# Patient Record
Sex: Female | Born: 1945 | Race: White | Hispanic: No | Smoking: Current every day smoker
Health system: Southern US, Community
[De-identification: ages and names within clinical notes are randomized; demographics above are authoritative.]

## PROBLEM LIST (undated history)

## (undated) DIAGNOSIS — R7303 Prediabetes: Secondary | ICD-10-CM

## (undated) DIAGNOSIS — I10 Essential (primary) hypertension: Secondary | ICD-10-CM

## (undated) DIAGNOSIS — N1831 Chronic kidney disease, stage 3a: Secondary | ICD-10-CM

## (undated) DIAGNOSIS — I700 Atherosclerosis of aorta: Secondary | ICD-10-CM

## (undated) DIAGNOSIS — M1990 Unspecified osteoarthritis, unspecified site: Secondary | ICD-10-CM

## (undated) DIAGNOSIS — E039 Hypothyroidism, unspecified: Secondary | ICD-10-CM

## (undated) DIAGNOSIS — F419 Anxiety disorder, unspecified: Secondary | ICD-10-CM

## (undated) DIAGNOSIS — T07XXXA Unspecified multiple injuries, initial encounter: Secondary | ICD-10-CM

## (undated) DIAGNOSIS — Z8719 Personal history of other diseases of the digestive system: Secondary | ICD-10-CM

## (undated) DIAGNOSIS — K219 Gastro-esophageal reflux disease without esophagitis: Secondary | ICD-10-CM

## (undated) DIAGNOSIS — I779 Disorder of arteries and arterioles, unspecified: Secondary | ICD-10-CM

## (undated) DIAGNOSIS — H6090 Unspecified otitis externa, unspecified ear: Secondary | ICD-10-CM

## (undated) DIAGNOSIS — R413 Other amnesia: Secondary | ICD-10-CM

## (undated) DIAGNOSIS — J439 Emphysema, unspecified: Secondary | ICD-10-CM

## (undated) DIAGNOSIS — M810 Age-related osteoporosis without current pathological fracture: Secondary | ICD-10-CM

## (undated) DIAGNOSIS — E040 Nontoxic diffuse goiter: Secondary | ICD-10-CM

## (undated) DIAGNOSIS — E785 Hyperlipidemia, unspecified: Secondary | ICD-10-CM

## (undated) HISTORY — DX: Unspecified osteoarthritis, unspecified site: M19.90

## (undated) HISTORY — DX: Other amnesia: R41.3

## (undated) HISTORY — DX: Hyperlipidemia, unspecified: E78.5

## (undated) HISTORY — DX: Emphysema, unspecified: J43.9

## (undated) HISTORY — DX: Gastro-esophageal reflux disease without esophagitis: K21.9

## (undated) HISTORY — DX: Anxiety disorder, unspecified: F41.9

## (undated) HISTORY — DX: Prediabetes: R73.03

## (undated) HISTORY — DX: Nontoxic diffuse goiter: E04.0

## (undated) HISTORY — DX: Unspecified otitis externa, unspecified ear: H60.90

## (undated) HISTORY — DX: Chronic kidney disease, stage 3a: N18.31

## (undated) HISTORY — DX: Essential (primary) hypertension: I10

## (undated) HISTORY — DX: Personal history of other diseases of the digestive system: Z87.19

## (undated) HISTORY — DX: Unspecified multiple injuries, initial encounter: T07.XXXA

## (undated) HISTORY — DX: Age-related osteoporosis without current pathological fracture: M81.0

## (undated) HISTORY — DX: Disorder of arteries and arterioles, unspecified: I77.9

## (undated) HISTORY — DX: Hypothyroidism, unspecified: E03.9

## (undated) HISTORY — DX: Atherosclerosis of aorta: I70.0

---

## 1997-11-12 ENCOUNTER — Ambulatory Visit (HOSPITAL_COMMUNITY): Admission: RE | Admit: 1997-11-12 | Discharge: 1997-11-12 | Payer: Self-pay | Admitting: *Deleted

## 1998-02-25 ENCOUNTER — Ambulatory Visit (HOSPITAL_COMMUNITY): Admission: RE | Admit: 1998-02-25 | Discharge: 1998-02-25 | Payer: Self-pay | Admitting: *Deleted

## 1999-02-24 ENCOUNTER — Ambulatory Visit (HOSPITAL_COMMUNITY): Admission: RE | Admit: 1999-02-24 | Discharge: 1999-02-24 | Payer: Self-pay | Admitting: *Deleted

## 1999-06-11 ENCOUNTER — Encounter: Payer: Self-pay | Admitting: Internal Medicine

## 1999-06-11 ENCOUNTER — Ambulatory Visit (HOSPITAL_COMMUNITY): Admission: RE | Admit: 1999-06-11 | Discharge: 1999-06-11 | Payer: Self-pay | Admitting: Internal Medicine

## 1999-07-12 ENCOUNTER — Ambulatory Visit (HOSPITAL_COMMUNITY): Admission: RE | Admit: 1999-07-12 | Discharge: 1999-07-12 | Payer: Self-pay | Admitting: Neurology

## 1999-08-22 ENCOUNTER — Ambulatory Visit (HOSPITAL_COMMUNITY): Admission: RE | Admit: 1999-08-22 | Discharge: 1999-08-22 | Payer: Self-pay | Admitting: Neurology

## 1999-08-22 ENCOUNTER — Encounter: Payer: Self-pay | Admitting: Neurology

## 2000-01-09 ENCOUNTER — Other Ambulatory Visit: Admission: RE | Admit: 2000-01-09 | Discharge: 2000-01-09 | Payer: Self-pay | Admitting: *Deleted

## 2000-03-22 ENCOUNTER — Encounter: Payer: Self-pay | Admitting: Internal Medicine

## 2000-03-22 ENCOUNTER — Ambulatory Visit (HOSPITAL_COMMUNITY): Admission: RE | Admit: 2000-03-22 | Discharge: 2000-03-22 | Payer: Self-pay | Admitting: Internal Medicine

## 2000-07-25 ENCOUNTER — Encounter: Payer: Self-pay | Admitting: Internal Medicine

## 2000-07-25 ENCOUNTER — Ambulatory Visit (HOSPITAL_COMMUNITY): Admission: RE | Admit: 2000-07-25 | Discharge: 2000-07-25 | Payer: Self-pay | Admitting: Internal Medicine

## 2001-02-25 ENCOUNTER — Encounter: Payer: Self-pay | Admitting: Neurology

## 2001-02-25 ENCOUNTER — Ambulatory Visit (HOSPITAL_COMMUNITY): Admission: RE | Admit: 2001-02-25 | Discharge: 2001-02-25 | Payer: Self-pay | Admitting: Neurology

## 2001-03-24 ENCOUNTER — Other Ambulatory Visit: Admission: RE | Admit: 2001-03-24 | Discharge: 2001-03-24 | Payer: Self-pay | Admitting: *Deleted

## 2001-05-01 ENCOUNTER — Ambulatory Visit (HOSPITAL_BASED_OUTPATIENT_CLINIC_OR_DEPARTMENT_OTHER): Admission: RE | Admit: 2001-05-01 | Discharge: 2001-05-02 | Payer: Self-pay | Admitting: Orthopedic Surgery

## 2001-05-02 ENCOUNTER — Emergency Department (HOSPITAL_COMMUNITY): Admission: EM | Admit: 2001-05-02 | Discharge: 2001-05-02 | Payer: Self-pay | Admitting: Emergency Medicine

## 2001-05-02 ENCOUNTER — Encounter: Payer: Self-pay | Admitting: Emergency Medicine

## 2001-08-27 ENCOUNTER — Ambulatory Visit (HOSPITAL_BASED_OUTPATIENT_CLINIC_OR_DEPARTMENT_OTHER): Admission: RE | Admit: 2001-08-27 | Discharge: 2001-08-28 | Payer: Self-pay | Admitting: Orthopedic Surgery

## 2001-10-28 ENCOUNTER — Ambulatory Visit (HOSPITAL_COMMUNITY): Admission: RE | Admit: 2001-10-28 | Discharge: 2001-10-28 | Payer: Self-pay | Admitting: Family Medicine

## 2001-10-28 ENCOUNTER — Encounter: Payer: Self-pay | Admitting: Family Medicine

## 2002-04-14 ENCOUNTER — Other Ambulatory Visit: Admission: RE | Admit: 2002-04-14 | Discharge: 2002-04-14 | Payer: Self-pay | Admitting: *Deleted

## 2002-12-25 ENCOUNTER — Encounter: Payer: Self-pay | Admitting: Emergency Medicine

## 2002-12-25 ENCOUNTER — Emergency Department (HOSPITAL_COMMUNITY): Admission: EM | Admit: 2002-12-25 | Discharge: 2002-12-25 | Payer: Self-pay | Admitting: Emergency Medicine

## 2003-04-01 ENCOUNTER — Ambulatory Visit (HOSPITAL_BASED_OUTPATIENT_CLINIC_OR_DEPARTMENT_OTHER): Admission: RE | Admit: 2003-04-01 | Discharge: 2003-04-02 | Payer: Self-pay | Admitting: Orthopedic Surgery

## 2003-04-02 ENCOUNTER — Encounter (INDEPENDENT_AMBULATORY_CARE_PROVIDER_SITE_OTHER): Payer: Self-pay

## 2003-04-09 ENCOUNTER — Encounter: Payer: Self-pay | Admitting: Orthopedic Surgery

## 2003-04-09 ENCOUNTER — Encounter: Admission: RE | Admit: 2003-04-09 | Discharge: 2003-04-09 | Payer: Self-pay | Admitting: Orthopedic Surgery

## 2003-06-09 ENCOUNTER — Other Ambulatory Visit: Admission: RE | Admit: 2003-06-09 | Discharge: 2003-06-09 | Payer: Self-pay | Admitting: *Deleted

## 2004-01-14 ENCOUNTER — Ambulatory Visit (HOSPITAL_COMMUNITY): Admission: RE | Admit: 2004-01-14 | Discharge: 2004-01-14 | Payer: Self-pay | Admitting: *Deleted

## 2004-01-27 ENCOUNTER — Encounter: Admission: RE | Admit: 2004-01-27 | Discharge: 2004-01-27 | Payer: Self-pay | Admitting: Otolaryngology

## 2004-03-24 ENCOUNTER — Encounter: Admission: RE | Admit: 2004-03-24 | Discharge: 2004-03-24 | Payer: Self-pay | Admitting: Otolaryngology

## 2004-06-30 ENCOUNTER — Encounter (INDEPENDENT_AMBULATORY_CARE_PROVIDER_SITE_OTHER): Payer: Self-pay | Admitting: Specialist

## 2004-06-30 ENCOUNTER — Ambulatory Visit (HOSPITAL_COMMUNITY): Admission: RE | Admit: 2004-06-30 | Discharge: 2004-06-30 | Payer: Self-pay | Admitting: *Deleted

## 2005-06-19 ENCOUNTER — Encounter: Admission: RE | Admit: 2005-06-19 | Discharge: 2005-06-19 | Payer: Self-pay | Admitting: Family Medicine

## 2006-01-16 ENCOUNTER — Encounter: Payer: Self-pay | Admitting: Emergency Medicine

## 2006-04-05 ENCOUNTER — Encounter: Admission: RE | Admit: 2006-04-05 | Discharge: 2006-04-05 | Payer: Self-pay | Admitting: Family Medicine

## 2006-04-15 ENCOUNTER — Encounter: Admission: RE | Admit: 2006-04-15 | Discharge: 2006-04-15 | Payer: Self-pay | Admitting: Family Medicine

## 2006-11-12 ENCOUNTER — Other Ambulatory Visit: Admission: RE | Admit: 2006-11-12 | Discharge: 2006-11-12 | Payer: Self-pay | Admitting: *Deleted

## 2007-05-09 ENCOUNTER — Encounter: Admission: RE | Admit: 2007-05-09 | Discharge: 2007-05-09 | Payer: Self-pay | Admitting: Family Medicine

## 2007-05-20 ENCOUNTER — Encounter: Admission: RE | Admit: 2007-05-20 | Discharge: 2007-05-20 | Payer: Self-pay | Admitting: Family Medicine

## 2007-05-20 ENCOUNTER — Other Ambulatory Visit: Admission: RE | Admit: 2007-05-20 | Discharge: 2007-05-20 | Payer: Self-pay | Admitting: Interventional Radiology

## 2007-05-20 ENCOUNTER — Encounter (INDEPENDENT_AMBULATORY_CARE_PROVIDER_SITE_OTHER): Payer: Self-pay | Admitting: Interventional Radiology

## 2007-10-10 ENCOUNTER — Encounter: Admission: RE | Admit: 2007-10-10 | Discharge: 2007-10-10 | Payer: Self-pay | Admitting: Family Medicine

## 2007-11-05 ENCOUNTER — Encounter: Admission: RE | Admit: 2007-11-05 | Discharge: 2007-11-05 | Payer: Self-pay | Admitting: Endocrinology

## 2008-05-03 ENCOUNTER — Encounter: Admission: RE | Admit: 2008-05-03 | Discharge: 2008-05-03 | Payer: Self-pay | Admitting: Endocrinology

## 2008-08-03 ENCOUNTER — Encounter: Admission: RE | Admit: 2008-08-03 | Discharge: 2008-08-03 | Payer: Self-pay | Admitting: Family Medicine

## 2009-09-13 ENCOUNTER — Encounter: Admission: RE | Admit: 2009-09-13 | Discharge: 2009-09-13 | Payer: Self-pay | Admitting: Family Medicine

## 2010-04-26 ENCOUNTER — Encounter: Admission: RE | Admit: 2010-04-26 | Discharge: 2010-04-26 | Payer: Self-pay | Admitting: Endocrinology

## 2010-05-08 ENCOUNTER — Encounter: Admission: RE | Admit: 2010-05-08 | Discharge: 2010-05-08 | Payer: Self-pay | Admitting: Family Medicine

## 2010-10-07 ENCOUNTER — Encounter: Payer: Self-pay | Admitting: Family Medicine

## 2010-10-10 ENCOUNTER — Other Ambulatory Visit: Payer: Self-pay | Admitting: Family Medicine

## 2010-10-10 DIAGNOSIS — Z1239 Encounter for other screening for malignant neoplasm of breast: Secondary | ICD-10-CM

## 2010-10-20 ENCOUNTER — Ambulatory Visit
Admission: RE | Admit: 2010-10-20 | Discharge: 2010-10-20 | Disposition: A | Discharge status: Home / Self Care | Payer: 59 | Source: Ambulatory Visit | Attending: Family Medicine | Admitting: Family Medicine

## 2010-10-20 DIAGNOSIS — Z1239 Encounter for other screening for malignant neoplasm of breast: Secondary | ICD-10-CM

## 2011-02-02 NOTE — Op Note (Signed)
NAME:  Cheryl Burton, Cheryl Burton                          ACCOUNT NO.:  [id number]   MEDICAL RECORD NO.:  [id number]                   PATIENT TYPE:  AMB   LOCATION:  DSC                                  FACILITY:  [facility]   PHYSICIAN:  Loreta Ave, M.D.              DATE OF BIRTH:  1946-06-11   DATE OF PROCEDURE:  04/01/2003  DATE OF DISCHARGE:                                 OPERATIVE REPORT   PREOPERATIVE DIAGNOSIS:  Interosseous symptomatic cyst, distal tibia at  right ankle medial aspect.  Reactive synovitis, chondromalacia of right  ankle.   POSTOPERATIVE DIAGNOSIS:  Interosseous symptomatic cyst, distal tibia at  right ankle medial aspect.  Reactive synovitis, chondromalacia of right  ankle, without extension into the ankle form the cyst.   OPERATION PERFORMED:  Right ankle examination under anesthesia, arthroscopy  with chondroplasty and synovectomy.  Open curettage of tibial cyst and  treatment with allograft __________ AlloMatrix grafting.   SURGEON:  Loreta Ave, M.D.   ASSISTANT:  Arlys John D. Petrarca, P.A.-C.   ANESTHESIA:  General.   ESTIMATED BLOOD LOSS:  Minimal.   TOURNIQUET TIME:  45 minutes.   SPECIMENS:  Cyst lining as well as bony nidus.   CULTURES:  None.   COMPLICATIONS:  None.   DRESSING:  Soft compressive.   DESCRIPTION OF PROCEDURE:  The patient was brought to the operating room and  placed on the operating table in supine position.  After adequate anesthesia  had been obtained, right ankle examined.  Good motion, good stability.  Intraosseous cyst was evident in the tibia just above the ankle and slightly  medial to it at the top of the medial malleolus. Leg holder for ankle  arthroscopy applied after tourniquet.  Prepped and draped in the usual  sterile fashion.  Exsanguinated with elevation and Esmarch.  Tourniquet  inflated to 300 mmHg.  Anteromedial and anterolateral arthroscopy portals  for the ankle.  The ankle entered with blunt obturator  and distended and  inspected!  Anterior synovitis debrided.  Little chondromalacia at the  medial talar dome, focal grade 2, debrided.  No evidence of extension into  the ankle from the cyst itself.  No other abnormalities in the ankle.  The  instruments and fluid removed.  Longitudinal incision over the tibia and  medial aspect.  Skin and subcutaneous tissue divided.  Periosteum elevated.  A cortical window a little bit more than 1 cm square was removed in the  tibia directly over the cyst.  Relatively soft bone there but not a complete  fracture.  The cyst was entered a few millimeters below the cortical margin.  This was an obvious semi solid but also fluid filled cyst with a soft tissue  lining that was completely excised.  At the deep end of the cyst towards the  lateral aspect, there was a red bony lesion that looked like a nidus,  possible  osteoid osteoma.  This was curetted out and sent as a separate  specimen.  The entire cyst was curetted back to healthy bleeding bone with  fluoroscopic confirmation.  It was then packed with __________ AlloMatrix  and the cortical window was placed overtop of this.  Periosteum closed on  top of the Vicryl.  Skin closed with nylon.  Sterile compressive dressing  applied after Marcaine was injected in the incision and ankle and the  portals closed with nylon.  Bulky short leg splint applied.  Tourniquet  deflated and removed.  Anesthesia reversed.  Brought to recovery room.  Tolerated surgery well.  No complications.                                                 Loreta Ave, M.D.    DFM/MEDQ  D:  04/01/2003  T:  04/02/2003  Job:  [id number]

## 2011-02-02 NOTE — Op Note (Signed)
[place]  Patient:    Cheryl Burton, Cheryl Burton Visit Number: [id number] MRN: [id number]          Service Type: DSU Location: [facility] Attending Physician:  Colbert Ewing Dictated by:   Loreta Ave, M.D. Proc. Date: 08/27/01 Admit Date:  08/27/2001                             Operative Report  PREOPERATIVE DIAGNOSIS:  Recurrent rotator cuff tear, right shoulder.  POSTOPERATIVE DIAGNOSIS:  Recurrent rotator cuff tear, right shoulder.  OPERATIVE PROCEDURE:  Repair of recurrent rotator cuff tear, right shoulder including repair of both supraspinatus and infraspinatus tendon avulsion, breaking up subacromial adhesions.  SURGEON:  Loreta Ave, M.D.  ASSISTANT:  Arlys John D. Petrarca, P.A.-C.  ANESTHESIA:  General.  ESTIMATED BLOOD LOSS:  Minimal.  SPECIMENS:  None.  CULTURES:  None.  COMPLICATIONS:  None.  DRESSING:  Self-compressive with sling.  DESCRIPTION OF PROCEDURE:  The patient was brought to the operating room and placed on the operating table in the supine position.  After adequate anesthesia had been obtained, the right shoulder examined.  Good motion and good stability.  Placed in a beach chair position on the shoulder positioner, prepped and draped in the usual sterile fashion.  Anterior and lateral arthroscopic portals utilized.  Arthroscope introduced and the shoulder examined.  Good articular cartilage.  Confirmation of tearing of the supra and infraspinatus tendon with the suture still in place in bone, but the tendon ripped away from the sutures just medial to this.  Not markedly retracted and still mobile.  Biceps tendon and biceps anchor intact.  Subacromially still adequate decompression, but significant adhesions above the cuff, more medial to the humeral head.  With confirmation of pathology, instruments and fluid were removed.  Lateral portal was opened and through a deltoid splitting incision through the  previous incision.  Skin and subcutaneous tissue divided. Subacromial space accessed.  The cuff was assessed and then thoroughly debrided.  The thinned lateral portion of the cuff was excised. Intratendinous ________ extended over 1 cm to 1.5 cm.  This was mobilized and all adhesions above this that were scarring into the under surface of the acromion were completely removed, all the way back to the level of the muscle within the supra and infraspinatus.  I was then able to well-capture the cuff with three #2 fiberwire sutures.  New bony tunnels were created at the humeral attachment of the top of the infraspinatus as well as the entire supraspinatus.  Fiberwire sutures were then weaved through bony tunnels.  Once this was complete, I was able to pull the cuff down into a repair position for a firm watertight closure, and oversewed the sutures over the bony bridges to give a nice firm watertight closure of the cuff.  This yielded a nice firm repair.  Care was taken as the fiberwire weaved well medially into the tendon, so that I would capture as much as the tendon as possible, and I would not have a recurrent tear.  Once this was completely confirmed, the wound was thoroughly irrigated.  The deltoid splitting incision closed with Vicryl.  The skin and subcutaneous tissue closed with Vicryl.  Posterior portal closed with nylon.  Margins of the wound and subacromial space injected with Marcaine.  A sterile compressive dressing and sling applied.  Anesthesia reversed.  Brought to the recovery room.  Tolerated surgery well with  no complications. Dictated by:   Loreta Ave, M.D. Attending Physician:  Colbert Ewing DD:  08/27/01 TD:  08/28/01 Job: [id number]

## 2011-02-02 NOTE — Op Note (Signed)
[place]  Patient:    Cheryl Burton, Cheryl Burton                       MRN: [id number] Proc. Date: 05/01/01 Adm. Date:  [id number] Attending:  Colbert Ewing                           Operative Report  PREOPERATIVE DIAGNOSES: Complete rotator cuff tear right shoulder with chronic impingement and DJD a.c. joint.  POSTOPERATIVE DIAGNOSES:  Complete rotator cuff tear right shoulder with chronic impingement and DJD a.c. joint.  PROCEDURE: Right shoulder examination under anesthesia.  Arthroscopy. Debridement of rotator cuff.  Assessment of tear.  Arthroscopic acromioplasty, bursectomy and CA ligament release.  Excision distal clavicle.  Open repair rotator cuff tear.  Concept repair system.  Sutures tied over bony tunnels.  SURGEON:  Loreta Ave, M.D.  ASSISTANT:  Arlys John D. Petrarca, P.A.-C.  ANESTHESIA:  General.  ESTIMATED BLOOD LOSS:  Minimal.  SPECIMENS:  None.  CULTURES:  None.  COMPLICATIONS:  None.  DRESSINGS: Sterile compressive with shoulder immobilizer.  PROCEDURE:  The patient was brought to the operating room and placed on the operating table in supine position.  After adequate anesthesia had been obtained, right shoulder examined.  Full motion, good stability.  Placed in beach chair position on the shoulder positioner, prepped and draped in the usual sterile fashion.  Three arthroscopic portals anterior, posterior and lateral.  Shoulder entered with a blunt obturator, distended and inspected. Complete avulsion, supraspinatus tendon, with intertendinous tearing. Debrided with a shaver.  Although completely torn off it was mobile and able to be brought over to its attachment side.  Glenohumeral joint otherwise looked good in regards to articular cartilage, labrum, biceps tendon, and capsular ligamentous structures.  Cannula redirected subacromially.  Cuff further assessed.  Type 2 acromion.  Bursa resected.  Acromioplasty to  the top of the acromion with shaver and high speed bur.  CA ligament released. Distal clavicle grade 3 and 4 changes with impingement from distal spurs. Lateral cm sharply dissected.  Adequacy or decompression and clavicle excision confirmed viewing from all portals.  Instruments and fluid removed.  Lateral portal opened into a deltoid splitting incision.  The cuff assessed.  Well captured with 3, #2 fiber wire sutures which were brought out laterally.  A bony trough was made in the humerus adjacent to the tuberosity and prominence of the tuberosity trimmed with a bur.  Concept repair system was then used to create bony tunnels at the attachment site and fiber wire sutures were weaved through this.  They were then firmly tied over bone yielding a nice firm watertight closure of her cuff without undo tension.  Adequate of decompression confirmed digitally at the time of cuff repair.  Able to go through full motion without undue tension on repair.  Wound irrigated. Deltoid closed with Vicryl.  Skin and subcutaneous tissue with Vicryl.  Portals closed with nylon.  Margins of wounds, subacromial space injected with Marcaine.  Sterile compressive dressing and shoulder immobilizer applied.  Anesthesia reversed and brought to recovery room.  Tolerated surgery well.  No complications. DD:  05/01/01 TD:  05/01/01 Job: [id number]

## 2011-02-13 ENCOUNTER — Other Ambulatory Visit: Payer: Self-pay | Admitting: Endocrinology

## 2011-02-13 DIAGNOSIS — E049 Nontoxic goiter, unspecified: Secondary | ICD-10-CM

## 2011-03-02 ENCOUNTER — Ambulatory Visit
Admission: RE | Admit: 2011-03-02 | Discharge: 2011-03-02 | Disposition: A | Discharge status: Home / Self Care | Payer: 59 | Source: Ambulatory Visit | Attending: Endocrinology | Admitting: Endocrinology

## 2011-03-02 DIAGNOSIS — E049 Nontoxic goiter, unspecified: Secondary | ICD-10-CM

## 2011-04-30 ENCOUNTER — Other Ambulatory Visit: Payer: 59

## 2011-10-23 ENCOUNTER — Ambulatory Visit
Admission: RE | Admit: 2011-10-23 | Discharge: 2011-10-23 | Disposition: A | Discharge status: Home / Self Care | Payer: 59 | Source: Ambulatory Visit | Attending: Family Medicine | Admitting: Family Medicine

## 2011-10-23 ENCOUNTER — Other Ambulatory Visit: Payer: Self-pay | Admitting: Family Medicine

## 2011-10-23 DIAGNOSIS — F17200 Nicotine dependence, unspecified, uncomplicated: Secondary | ICD-10-CM

## 2011-10-23 DIAGNOSIS — R0781 Pleurodynia: Secondary | ICD-10-CM

## 2011-10-23 MED ORDER — IOHEXOL 300 MG/ML  SOLN
75.0000 mL | Freq: Once | INTRAMUSCULAR | Status: AC | PRN
  Administered 2011-10-23: 75 mL via INTRAVENOUS

## 2011-11-02 ENCOUNTER — Other Ambulatory Visit: Payer: Self-pay | Admitting: Family Medicine

## 2011-11-02 DIAGNOSIS — Z1231 Encounter for screening mammogram for malignant neoplasm of breast: Secondary | ICD-10-CM

## 2011-11-15 ENCOUNTER — Ambulatory Visit
Admission: RE | Admit: 2011-11-15 | Discharge: 2011-11-15 | Disposition: A | Discharge status: Home / Self Care | Payer: 59 | Source: Ambulatory Visit | Attending: Family Medicine | Admitting: Family Medicine

## 2011-11-15 DIAGNOSIS — Z1231 Encounter for screening mammogram for malignant neoplasm of breast: Secondary | ICD-10-CM

## 2012-09-01 ENCOUNTER — Ambulatory Visit (INDEPENDENT_AMBULATORY_CARE_PROVIDER_SITE_OTHER): Payer: Medicare Other | Admitting: *Deleted

## 2012-09-01 DIAGNOSIS — I6529 Occlusion and stenosis of unspecified carotid artery: Secondary | ICD-10-CM

## 2012-09-26 ENCOUNTER — Other Ambulatory Visit: Payer: Self-pay | Admitting: Endocrinology

## 2012-09-26 DIAGNOSIS — E049 Nontoxic goiter, unspecified: Secondary | ICD-10-CM

## 2012-09-29 ENCOUNTER — Ambulatory Visit
Admission: RE | Admit: 2012-09-29 | Discharge: 2012-09-29 | Disposition: A | Discharge status: Home / Self Care | Payer: Medicare Other | Source: Ambulatory Visit | Attending: Endocrinology | Admitting: Endocrinology

## 2012-09-29 DIAGNOSIS — E049 Nontoxic goiter, unspecified: Secondary | ICD-10-CM

## 2012-10-02 ENCOUNTER — Other Ambulatory Visit: Payer: Self-pay | Admitting: Endocrinology

## 2012-10-02 DIAGNOSIS — E049 Nontoxic goiter, unspecified: Secondary | ICD-10-CM

## 2013-02-12 ENCOUNTER — Other Ambulatory Visit: Payer: Self-pay

## 2013-02-12 DIAGNOSIS — Z1231 Encounter for screening mammogram for malignant neoplasm of breast: Secondary | ICD-10-CM

## 2013-03-13 ENCOUNTER — Ambulatory Visit: Payer: Medicare Other

## 2013-03-13 ENCOUNTER — Ambulatory Visit
Admission: RE | Admit: 2013-03-13 | Discharge: 2013-03-13 | Disposition: A | Discharge status: Home / Self Care | Payer: Medicare Other | Source: Ambulatory Visit

## 2013-03-13 DIAGNOSIS — Z1231 Encounter for screening mammogram for malignant neoplasm of breast: Secondary | ICD-10-CM

## 2013-04-06 ENCOUNTER — Other Ambulatory Visit: Payer: Medicare Other

## 2013-04-08 ENCOUNTER — Ambulatory Visit
Admission: RE | Admit: 2013-04-08 | Discharge: 2013-04-08 | Disposition: A | Discharge status: Home / Self Care | Payer: Medicare Other | Source: Ambulatory Visit | Attending: Endocrinology | Admitting: Endocrinology

## 2013-04-08 DIAGNOSIS — E049 Nontoxic goiter, unspecified: Secondary | ICD-10-CM

## 2013-09-30 ENCOUNTER — Other Ambulatory Visit: Payer: Self-pay | Admitting: Endocrinology

## 2013-09-30 DIAGNOSIS — E049 Nontoxic goiter, unspecified: Secondary | ICD-10-CM

## 2014-02-19 ENCOUNTER — Other Ambulatory Visit: Payer: Self-pay

## 2014-02-19 DIAGNOSIS — Z1231 Encounter for screening mammogram for malignant neoplasm of breast: Secondary | ICD-10-CM

## 2014-03-15 ENCOUNTER — Ambulatory Visit
Admission: RE | Admit: 2014-03-15 | Discharge: 2014-03-15 | Disposition: A | Discharge status: Home / Self Care | Payer: Medicare Other | Source: Ambulatory Visit

## 2014-03-15 ENCOUNTER — Encounter (INDEPENDENT_AMBULATORY_CARE_PROVIDER_SITE_OTHER): Payer: Self-pay

## 2014-03-15 DIAGNOSIS — Z1231 Encounter for screening mammogram for malignant neoplasm of breast: Secondary | ICD-10-CM

## 2014-09-22 ENCOUNTER — Other Ambulatory Visit: Payer: Medicare Other

## 2014-09-23 ENCOUNTER — Ambulatory Visit
Admission: RE | Admit: 2014-09-23 | Discharge: 2014-09-23 | Disposition: A | Discharge status: Home / Self Care | Payer: Medicare Other | Source: Ambulatory Visit | Attending: Endocrinology | Admitting: Endocrinology

## 2014-09-23 DIAGNOSIS — E049 Nontoxic goiter, unspecified: Secondary | ICD-10-CM

## 2015-04-05 ENCOUNTER — Other Ambulatory Visit: Payer: Self-pay | Admitting: Family Medicine

## 2015-04-05 DIAGNOSIS — Z1231 Encounter for screening mammogram for malignant neoplasm of breast: Secondary | ICD-10-CM

## 2015-04-11 ENCOUNTER — Ambulatory Visit
Admission: RE | Admit: 2015-04-11 | Discharge: 2015-04-11 | Disposition: A | Discharge status: Home / Self Care | Payer: Medicare Other | Source: Ambulatory Visit | Attending: Family Medicine | Admitting: Family Medicine

## 2015-04-11 DIAGNOSIS — Z1231 Encounter for screening mammogram for malignant neoplasm of breast: Secondary | ICD-10-CM

## 2015-10-06 ENCOUNTER — Other Ambulatory Visit: Payer: Self-pay | Admitting: Endocrinology

## 2015-10-07 ENCOUNTER — Other Ambulatory Visit: Payer: Self-pay | Admitting: Endocrinology

## 2015-10-07 DIAGNOSIS — E049 Nontoxic goiter, unspecified: Secondary | ICD-10-CM

## 2016-03-21 ENCOUNTER — Other Ambulatory Visit: Payer: Self-pay | Admitting: Family Medicine

## 2016-03-21 DIAGNOSIS — Z1231 Encounter for screening mammogram for malignant neoplasm of breast: Secondary | ICD-10-CM

## 2016-04-11 ENCOUNTER — Ambulatory Visit: Payer: Medicare Other

## 2016-04-19 ENCOUNTER — Ambulatory Visit: Payer: Medicare Other

## 2016-06-05 ENCOUNTER — Ambulatory Visit: Payer: Medicare Other

## 2016-06-08 ENCOUNTER — Ambulatory Visit
Admission: RE | Admit: 2016-06-08 | Discharge: 2016-06-08 | Disposition: A | Discharge status: Home / Self Care | Payer: Medicare Other | Source: Ambulatory Visit | Attending: Family Medicine | Admitting: Family Medicine

## 2016-06-08 DIAGNOSIS — Z1231 Encounter for screening mammogram for malignant neoplasm of breast: Secondary | ICD-10-CM

## 2016-10-04 ENCOUNTER — Other Ambulatory Visit: Payer: Medicare Other

## 2016-10-09 ENCOUNTER — Ambulatory Visit
Admission: RE | Admit: 2016-10-09 | Discharge: 2016-10-09 | Disposition: A | Discharge status: Home / Self Care | Payer: Medicare Other | Source: Ambulatory Visit | Attending: Endocrinology | Admitting: Endocrinology

## 2016-10-09 DIAGNOSIS — E049 Nontoxic goiter, unspecified: Secondary | ICD-10-CM

## 2017-01-09 ENCOUNTER — Ambulatory Visit (INDEPENDENT_AMBULATORY_CARE_PROVIDER_SITE_OTHER): Payer: Medicare Other

## 2017-01-09 ENCOUNTER — Ambulatory Visit (INDEPENDENT_AMBULATORY_CARE_PROVIDER_SITE_OTHER): Payer: Medicare Other | Admitting: Orthopedic Surgery

## 2017-01-09 ENCOUNTER — Encounter (INDEPENDENT_AMBULATORY_CARE_PROVIDER_SITE_OTHER): Payer: Self-pay | Admitting: Orthopedic Surgery

## 2017-01-09 VITALS — BP 123/77 | HR 75 | Ht 60.0 in | Wt 137.0 lb

## 2017-01-09 DIAGNOSIS — G8929 Other chronic pain: Secondary | ICD-10-CM | POA: Diagnosis not present

## 2017-01-09 DIAGNOSIS — M25552 Pain in left hip: Secondary | ICD-10-CM

## 2017-01-09 DIAGNOSIS — M25551 Pain in right hip: Secondary | ICD-10-CM | POA: Diagnosis not present

## 2017-01-09 DIAGNOSIS — M5442 Lumbago with sciatica, left side: Secondary | ICD-10-CM | POA: Diagnosis not present

## 2017-01-09 DIAGNOSIS — M7062 Trochanteric bursitis, left hip: Secondary | ICD-10-CM | POA: Diagnosis not present

## 2017-01-09 DIAGNOSIS — M7061 Trochanteric bursitis, right hip: Secondary | ICD-10-CM | POA: Insufficient documentation

## 2017-01-09 NOTE — Progress Notes (Signed)
Office Visit Note   Patient: Cheryl Burton           Date of Birth: [birth year]           MRN: [id number] Visit Date: 01/09/2017              Requested by: No referring provider defined for this encounter. PCP: Duane Lope, MD   Assessment & Plan: Visit Diagnoses:  1. Chronic midline low back pain with left-sided sciatica   2. Pain in left hip   3. Pain in right hip   4. Trochanteric bursitis of both hips     Plan:  #1: PT evaluation for stretches and Ionto/phonophoresis to both trochanteric regions. #2: Samples of Pennsaid that she continues on the trochanteric regions #3: If her symptoms are not improved then we will consider an MRI scan of the pelvis  Follow-Up Instructions: Return if symptoms worsen or fail to improve.   Orders:  Orders Placed This Encounter  Procedures  . XR Lumbar Spine 2-3 Views  . XR HIP UNILAT W OR W/O PELVIS 2-3 VIEWS LEFT  . Ambulatory referral to Physical Therapy   No orders of the defined types were placed in this encounter.     Procedures: No procedures performed   Clinical Data: No additional findings.   Subjective: Chief Complaint  Patient presents with  . Left Hip - Pain  . Right Hip - Pain  . New Evaluation    Bilateral lateral hip pain and low back pain ongoing x 6 months. PCP recommended she discontinue lipitor this has helped some (off for 6wks now).   . New Evaluation    Groin pain on occasion. Denies radicular pain down the legs. Denies numbness or tingling.  . New Evaluation    Taking tyenol prn    Cheryl Burton is a very pleasant 71 year old white female with care for in the past. She states that she has ongoing low back pain which appears unchanged. Though she states that she has had pain in both lower extremities as well as in the lateral aspects of her hip. She brought that up to her medical doctor who chose to stop her Lipitor which she states did have benefit especially in the areas below her knees. However she continues  to have pain at the trochanteric region of both hips. She denies groin pain. Denies any recent history of injury or trauma. Seen today for evaluation.    Review of Systems  Psychiatric/Behavioral:       History of depression and nervous tension  All other systems reviewed and are negative.    Objective: Vital Signs: BP 123/77 (BP Location: Right Arm, Patient Position: Sitting)   Pulse 75   Ht 5' (1.524 m)   Wt 137 lb (62.1 kg)   BMI 26.76 kg/m   Physical Exam  Constitutional: She is oriented to person, place, and time. She appears well-developed and well-nourished.  HENT:  Head: Normocephalic and atraumatic.  Eyes: EOM are normal. Pupils are equal, round, and reactive to light.  Pulmonary/Chest: Effort normal.  Neurological: She is alert and oriented to person, place, and time.  Skin: Skin is warm and dry.  Psychiatric: She has a normal mood and affect. Her behavior is normal. Judgment and thought content normal.    Ortho Exam  Today she has tenderness over the trochanteric region of both hips. She's got good motion of her hips. She is neurovascular intact distally. She also has some tenderness to palpation over the  lower lumbar spine. Negative straight leg raising.  Specialty Comments:  No specialty comments available.  Imaging: Xr Hip Unilat W Or W/o Pelvis 2-3 Views Left  Result Date: 01/09/2017 Three-view x-rays of the pelvis and hips reveals the greater trochanteric area has spurring noted bilaterally. 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 Joint spaces are decreased but the wound in both hips. Seemed to have osteopenia radiographically  Xr Lumbar Spine 2-3 Views  Result Date: 01/09/2017 Two-view lumbar spine reveals significant degenerative disc disease and narrowing at L5-S1. Foraminal narrowing is also noted at that area. There appears to be and grade 1 spondylolisthesis of  L4 on 5. She also has some sclerosing superior L2 and L3. She does have a mild scoliosis noted to the lumbar spine.    PMFS History: Patient Active Problem List   Diagnosis Date Noted  . Chronic midline low back pain with left-sided sciatica 01/09/2017  . Pain in right hip 01/09/2017  . Trochanteric bursitis of both hips 01/09/2017   History reviewed. No pertinent past medical history.  History reviewed. No pertinent family history.  History reviewed. No pertinent surgical history. Social History   Occupational History  . Not on file.   Social History Main Topics  . Smoking status: Never Smoker  . Smokeless tobacco: Never Used  . Alcohol use Not on file  . Drug use: Unknown  . Sexual activity: Not on file

## 2017-03-07 ENCOUNTER — Other Ambulatory Visit: Payer: Self-pay | Admitting: Family Medicine

## 2017-03-07 DIAGNOSIS — R1011 Right upper quadrant pain: Secondary | ICD-10-CM

## 2017-03-15 ENCOUNTER — Ambulatory Visit
Admission: RE | Admit: 2017-03-15 | Discharge: 2017-03-15 | Disposition: A | Discharge status: Home / Self Care | Payer: Medicare Other | Source: Ambulatory Visit | Attending: Family Medicine | Admitting: Family Medicine

## 2017-03-15 DIAGNOSIS — R1011 Right upper quadrant pain: Secondary | ICD-10-CM

## 2017-06-20 ENCOUNTER — Other Ambulatory Visit: Payer: Self-pay | Admitting: Family Medicine

## 2017-06-20 DIAGNOSIS — Z1231 Encounter for screening mammogram for malignant neoplasm of breast: Secondary | ICD-10-CM

## 2017-07-03 ENCOUNTER — Ambulatory Visit: Payer: Medicare Other

## 2017-07-04 ENCOUNTER — Ambulatory Visit: Payer: Medicare Other

## 2017-07-23 ENCOUNTER — Ambulatory Visit
Admission: RE | Admit: 2017-07-23 | Discharge: 2017-07-23 | Disposition: A | Discharge status: Home / Self Care | Payer: Medicare Other | Source: Ambulatory Visit | Attending: Family Medicine | Admitting: Family Medicine

## 2017-07-23 DIAGNOSIS — Z1231 Encounter for screening mammogram for malignant neoplasm of breast: Secondary | ICD-10-CM

## 2017-10-14 ENCOUNTER — Other Ambulatory Visit: Payer: Self-pay | Admitting: Acute Care

## 2017-10-14 DIAGNOSIS — Z122 Encounter for screening for malignant neoplasm of respiratory organs: Secondary | ICD-10-CM

## 2017-10-14 DIAGNOSIS — F17210 Nicotine dependence, cigarettes, uncomplicated: Secondary | ICD-10-CM

## 2017-10-21 ENCOUNTER — Ambulatory Visit (INDEPENDENT_AMBULATORY_CARE_PROVIDER_SITE_OTHER): Payer: Medicare Other | Admitting: Acute Care

## 2017-10-21 ENCOUNTER — Ambulatory Visit (INDEPENDENT_AMBULATORY_CARE_PROVIDER_SITE_OTHER)
Admission: RE | Admit: 2017-10-21 | Discharge: 2017-10-21 | Disposition: A | Discharge status: Home / Self Care | Payer: Medicare Other | Source: Ambulatory Visit | Attending: Acute Care | Admitting: Acute Care

## 2017-10-21 ENCOUNTER — Encounter: Payer: Self-pay | Admitting: Acute Care

## 2017-10-21 DIAGNOSIS — Z122 Encounter for screening for malignant neoplasm of respiratory organs: Secondary | ICD-10-CM

## 2017-10-21 DIAGNOSIS — F17210 Nicotine dependence, cigarettes, uncomplicated: Secondary | ICD-10-CM

## 2017-10-21 NOTE — Progress Notes (Signed)
Shared Decision Making Visit Lung Cancer Screening Program [phone]([id number])   Eligibility:  Age 72 y.o.  Pack Years Smoking History Calculation 30 pack year smoking history (# packs/per year x # years smoked)  Recent History of coughing up blood  no  Unexplained weight loss? no ( >Than 15 pounds within the last 6 months )  Prior History Lung / other cancer no (Diagnosis within the last 5 years already requiring surveillance chest CT Scans).  Smoking Status Current Smoker  Former Smokers: Years since quit: NA  Quit Date: NA  Visit Components:  Discussion included one or more decision making aids. yes  Discussion included risk/benefits of screening. yes  Discussion included potential follow up diagnostic testing for abnormal scans. yes  Discussion included meaning and risk of over diagnosis. yes  Discussion included meaning and risk of False Positives. yes  Discussion included meaning of total radiation exposure. yes  Counseling Included:  Importance of adherence to annual lung cancer LDCT screening. yes  Impact of comorbidities on ability to participate in the program. yes  Ability and willingness to under diagnostic treatment. yes  Smoking Cessation Counseling:  Current Smokers:   Discussed importance of smoking cessation. yes  Information about tobacco cessation classes and interventions provided to patient. yes  Patient provided with "ticket" for LDCT Scan. yes  Symptomatic Patient. no  Counseling  Diagnosis Code: Tobacco Use Z72.0  Asymptomatic Patient yes  Counseling (Intermediate counseling: > three minutes counseling) [id number]  Former Smokers:   Discussed the importance of maintaining cigarette abstinence. yes  Diagnosis Code: Personal History of Nicotine Dependence. Y86.578Z87.891  Information about tobacco cessation classes and interventions provided to patient. Yes  Patient provided with "ticket" for LDCT Scan. yes  Written Order for Lung Cancer  Screening with LDCT placed in Epic. Yes (CT Chest Lung Cancer Screening Low Dose W/O CM) [id number] Z12.2-Screening of respiratory organs Z87.891-Personal history of nicotine dependence   I have spent 25 minutes of face to face time with Ms. Cheryl Burton discussing the risks and benefits of lung cancer screening. We viewed a power point together that explained in detail the above noted topics. We paused at intervals to allow for questions to be asked and answered to ensure understanding.We discussed that the single most powerful action that she can take to decrease her risk of developing lung cancer is to quit smoking. We discussed whether or not she  is ready to commit to setting a quit date. We discussed options for tools to aid in quitting smoking including nicotine replacement therapy, non-nicotine medications, support groups, Quit Smart classes, and behavior modification. We discussed that often times setting smaller, more achievable goals, such as eliminating 1 cigarette a day for a week and then 2 cigarettes a day for a week can be helpful in slowly decreasing the number of cigarettes smoked. This allows for a sense of accomplishment as well as providing a clinical benefit. I gave her the " Be Stronger Than Your Excuses" card with contact information for community resources, classes, free nicotine replacement therapy, and access to mobile apps, text messaging, and on-line smoking cessation help. I have also given her my card and contact information in the event she needs to contact me. We discussed the time and location of the scan, and that either Abigail Miyamotoenise Phelps RN or I will call with the results within 24-48 hours of receiving them. I have offered her  a copy of the power point we viewed  as a resource in the event they need  reinforcement of the concepts we discussed today in the office. The patient verbalized understanding of all of  the above and had no further questions upon leaving the office. They have  my contact information in the event they have any further questions.  I spent 4 minutes counseling on smoking cessation and the health risks of continued tobacco abuse.  I explained to the patient that there has been a high incidence of coronary artery disease noted on these exams. I explained that this is a non-gated exam therefore degree or severity cannot be determined. This patient is currently on statin therapy. I have asked the patient to follow-up with their PCP regarding any incidental finding of coronary artery disease and management with diet or medication as their PCP  feels is clinically indicated. The patient verbalized understanding of the above and had no further questions upon completion of the visit.     Bevelyn Ngo, NP 10/21/2017

## 2017-10-25 ENCOUNTER — Telehealth: Payer: Self-pay | Admitting: Acute Care

## 2017-10-25 DIAGNOSIS — F17210 Nicotine dependence, cigarettes, uncomplicated: Secondary | ICD-10-CM

## 2017-10-25 DIAGNOSIS — Z122 Encounter for screening for malignant neoplasm of respiratory organs: Secondary | ICD-10-CM

## 2017-10-25 NOTE — Telephone Encounter (Signed)
Pt informed of CT results per Sarah Groce, NP.  PT verbalized understanding.  Copy sent to PCP.  Order placed for 1 yr f/u CT.  

## 2017-11-07 ENCOUNTER — Other Ambulatory Visit: Payer: Self-pay | Admitting: Family Medicine

## 2017-11-07 DIAGNOSIS — R918 Other nonspecific abnormal finding of lung field: Secondary | ICD-10-CM

## 2017-11-19 ENCOUNTER — Ambulatory Visit
Admission: RE | Admit: 2017-11-19 | Discharge: 2017-11-19 | Disposition: A | Discharge status: Home / Self Care | Payer: Medicare Other | Source: Ambulatory Visit | Attending: Family Medicine | Admitting: Family Medicine

## 2017-11-19 DIAGNOSIS — R918 Other nonspecific abnormal finding of lung field: Secondary | ICD-10-CM

## 2018-06-25 ENCOUNTER — Other Ambulatory Visit: Payer: Self-pay | Admitting: Endocrinology

## 2018-06-25 DIAGNOSIS — E049 Nontoxic goiter, unspecified: Secondary | ICD-10-CM

## 2018-06-30 ENCOUNTER — Ambulatory Visit
Admission: RE | Admit: 2018-06-30 | Discharge: 2018-06-30 | Disposition: A | Discharge status: Home / Self Care | Payer: Medicare Other | Source: Ambulatory Visit | Attending: Endocrinology | Admitting: Endocrinology

## 2018-06-30 DIAGNOSIS — E049 Nontoxic goiter, unspecified: Secondary | ICD-10-CM

## 2018-10-20 ENCOUNTER — Ambulatory Visit (INDEPENDENT_AMBULATORY_CARE_PROVIDER_SITE_OTHER): Payer: Medicare Other | Admitting: Family Medicine

## 2018-10-20 ENCOUNTER — Encounter (INDEPENDENT_AMBULATORY_CARE_PROVIDER_SITE_OTHER): Payer: Self-pay | Admitting: Family Medicine

## 2018-10-20 DIAGNOSIS — M7061 Trochanteric bursitis, right hip: Secondary | ICD-10-CM | POA: Diagnosis not present

## 2018-10-20 DIAGNOSIS — M79671 Pain in right foot: Secondary | ICD-10-CM | POA: Diagnosis not present

## 2018-10-20 DIAGNOSIS — M25551 Pain in right hip: Secondary | ICD-10-CM

## 2018-10-20 DIAGNOSIS — M79672 Pain in left foot: Secondary | ICD-10-CM | POA: Diagnosis not present

## 2018-10-20 MED ORDER — NITROGLYCERIN 0.1 MG/HR TD PT24: MEDICATED_PATCH | TRANSDERMAL | 3 refills | Status: DC

## 2018-10-20 NOTE — Addendum Note (Signed)
Addended by: Lillia Carmel on: 10/20/2018 01:14 PM   Modules accepted: Orders

## 2018-10-20 NOTE — Progress Notes (Addendum)
Office Visit Note   Patient: Cheryl Burton           Date of Birth: [birth year]           MRN: [id number] Visit Date: 10/20/2018 Requested by: Daisy Florooss, Charles Alan, MD [address] [place], [place] [address] PCP: Daisy Florooss, Charles Alan, MD  Subjective: Chief Complaint  Patient presents with  . Right Foot - Pain    Pain and discoloration 2nd toe since Summer 2019  . Left Foot - Pain    Pain and discoloration in 3rd toe since Summer 2019. Lump on bottom of foot.  . Right Hip - Pain    Pain lateral and posterior hip (has seen Jacqualine CodeBrian Petrarca, PA in the past for this).    HPI: She is a 73 year old with right hip and bilateral foot pain.  Hip started bothering her in the past month or 2.  History of trochanteric syndrome in the past.  Pain on the posterior lateral aspect.  For the past 6 months she has had intermittent pain and discoloration in her left third and right second toe.  There is also a sore on the bottom of her left third MTP joint that has been very slow to heal.  She is a long-term smoker, approximately 1/2 pack daily.                ROS: She has a history of osteoporosis.  Thyroid has been well controlled.  She has prediabetes.  No history of cardiac disease or arrhythmias.Other systems were reviewed and are negative.    Objective: Vital Signs: There were no vitals taken for this visit.  Physical Exam:  Right hip: No pain with internal rotation and she has good range of motion.  She is tender on the posterior aspect of the greater trochanter. Feet: 2+ dorsalis pedis pulse in both feet.  She has erythema with no warmth in the right foot second toe and left third toe.  Sluggish capillary refill.  On the plantar aspect of her left foot third MTP joint there is an area of erythema with no opening, slightly tender to palpation.  No warmth.  Imaging: None today.  Previous hip x-rays showed spurring at the greater trochanter with well-preserved joint space.  Assessment &  Plan: 1.  Right hip greater trochanter syndrome -Pen said, lateral leg raises.  Physical therapy or injection if symptoms persist.  2.  Right second and left third toe erythema and pain, possibly abnormality of microcirculation which could be related to smoking.  Buerger's disease is a consideration. -Strongly encouraged her to quit smoking. -Trial of nitroglycerin patch therapy just proximal to the affected toes. -Vascular studies will be ordered ([facility]).   Follow-Up Instructions: No follow-ups on file.      Procedures: No procedures performed  No notes on file    PMFS History: Patient Active Problem List   Diagnosis Date Noted  . Chronic midline low back pain with left-sided sciatica 01/09/2017  . Pain in right hip 01/09/2017  . Trochanteric bursitis of both hips 01/09/2017   History reviewed. No pertinent past medical history.  Family History  Problem Relation Age of Onset  . Breast cancer Neg Hx     History reviewed. No pertinent surgical history. Social History   Occupational History  . Not on file  Tobacco Use  . Smoking status: Current Every Day Smoker    Packs/day: 0.90    Years: 34.00    Pack years: 30.60  Types: Cigarettes  . Smokeless tobacco: Never Used  . Tobacco comment: Smokes afew cigarettes a day based on stress  Substance and Sexual Activity  . Alcohol use: Not on file  . Drug use: Not on file  . Sexual activity: Not on file

## 2018-10-23 ENCOUNTER — Inpatient Hospital Stay: Admission: RE | Admit: 2018-10-23 | Payer: Medicare Other | Source: Ambulatory Visit

## 2018-10-28 ENCOUNTER — Telehealth (INDEPENDENT_AMBULATORY_CARE_PROVIDER_SITE_OTHER): Payer: Self-pay | Admitting: Family Medicine

## 2018-10-28 ENCOUNTER — Ambulatory Visit (HOSPITAL_COMMUNITY)
Admission: RE | Admit: 2018-10-28 | Discharge: 2018-10-28 | Disposition: A | Discharge status: Home / Self Care | Payer: Medicare Other | Source: Ambulatory Visit | Attending: Family | Admitting: Family

## 2018-10-28 DIAGNOSIS — M79672 Pain in left foot: Secondary | ICD-10-CM | POA: Diagnosis present

## 2018-10-28 DIAGNOSIS — M79671 Pain in right foot: Secondary | ICD-10-CM | POA: Insufficient documentation

## 2018-10-28 NOTE — Telephone Encounter (Signed)
Dopplers look normal.

## 2018-10-29 NOTE — Telephone Encounter (Signed)
I called and advised the patient. She reported the NitroDur patches are helping. Dr. Prince Rome advised.

## 2018-11-04 ENCOUNTER — Ambulatory Visit (INDEPENDENT_AMBULATORY_CARE_PROVIDER_SITE_OTHER): Payer: Medicare Other | Admitting: Orthopedic Surgery

## 2018-11-11 ENCOUNTER — Ambulatory Visit (INDEPENDENT_AMBULATORY_CARE_PROVIDER_SITE_OTHER)
Admission: RE | Admit: 2018-11-11 | Discharge: 2018-11-11 | Disposition: A | Discharge status: Home / Self Care | Payer: Medicare Other | Source: Ambulatory Visit | Attending: Acute Care | Admitting: Acute Care

## 2018-11-11 DIAGNOSIS — F17210 Nicotine dependence, cigarettes, uncomplicated: Secondary | ICD-10-CM | POA: Diagnosis not present

## 2018-11-11 DIAGNOSIS — Z122 Encounter for screening for malignant neoplasm of respiratory organs: Secondary | ICD-10-CM

## 2018-11-12 ENCOUNTER — Telehealth: Payer: Self-pay | Admitting: Acute Care

## 2018-11-12 DIAGNOSIS — Z122 Encounter for screening for malignant neoplasm of respiratory organs: Secondary | ICD-10-CM

## 2018-11-12 DIAGNOSIS — F17210 Nicotine dependence, cigarettes, uncomplicated: Secondary | ICD-10-CM

## 2018-11-12 DIAGNOSIS — Z87891 Personal history of nicotine dependence: Secondary | ICD-10-CM

## 2018-11-12 NOTE — Telephone Encounter (Signed)
Pt informed of CT results per Sarah Groce, NP.  PT verbalized understanding.  Copy sent to PCP.  Order placed for 1 yr f/u CT.  

## 2018-11-26 ENCOUNTER — Other Ambulatory Visit: Payer: Self-pay | Admitting: Family Medicine

## 2018-11-26 DIAGNOSIS — Z1231 Encounter for screening mammogram for malignant neoplasm of breast: Secondary | ICD-10-CM

## 2018-12-30 ENCOUNTER — Ambulatory Visit: Payer: Medicare Other

## 2019-01-28 ENCOUNTER — Other Ambulatory Visit: Payer: Self-pay | Admitting: Family Medicine

## 2019-01-28 MED ORDER — NITROGLYCERIN 0.1 MG/HR TD PT24: MEDICATED_PATCH | TRANSDERMAL | 3 refills | Status: DC

## 2019-02-10 IMAGING — CT CT CHEST LUNG CANCER SCREENING LOW DOSE W/O CM
2 of 4 series · 15 of 40 positions shown, 18 images · non-contrast
Comparison: No prior screening exam. Diagnostic CT 10/23/2011
reviewed.

CLINICAL DATA: Asymptomatic current smoker with 30 pack-year
history.

EXAM:
CT CHEST WITHOUT CONTRAST LOW-DOSE FOR LUNG CANCER SCREENING
TECHNIQUE: Multidetector CT imaging of the chest was performed following the
standard protocol without IV contrast.

[Series 2: thorax 5.0 i31f 3 · axial · 0.71mm/px · z∈[-119,+146]mm · 12 of 61 slices shown, 15 images]
[im 5/61  mediastinal]
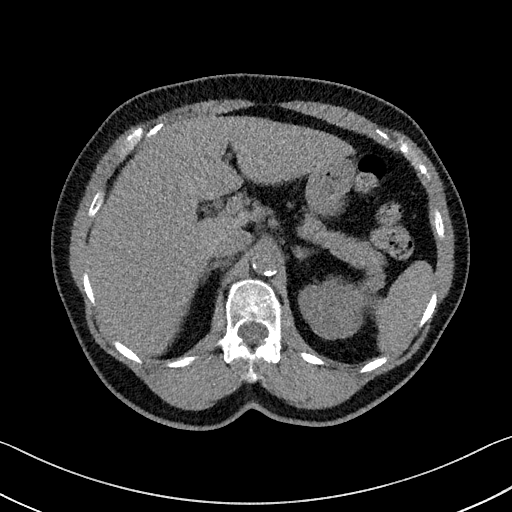
[im 5/61  lung]
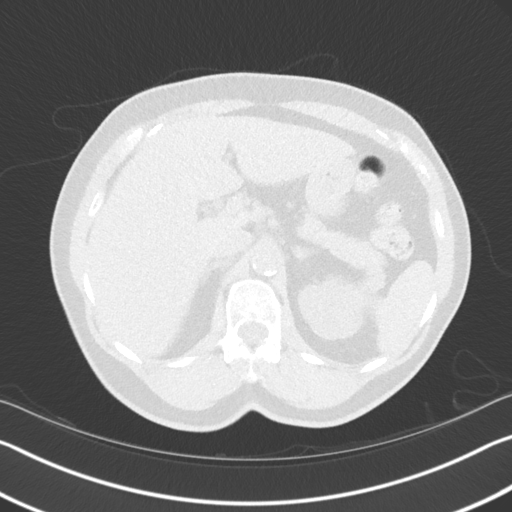
[im 10/61  lung]
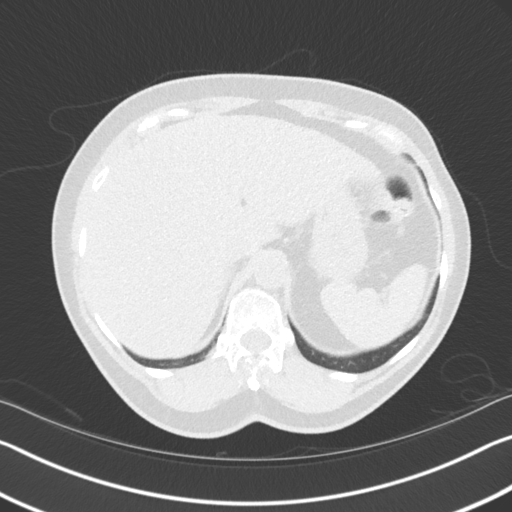
[im 15/61  lung]
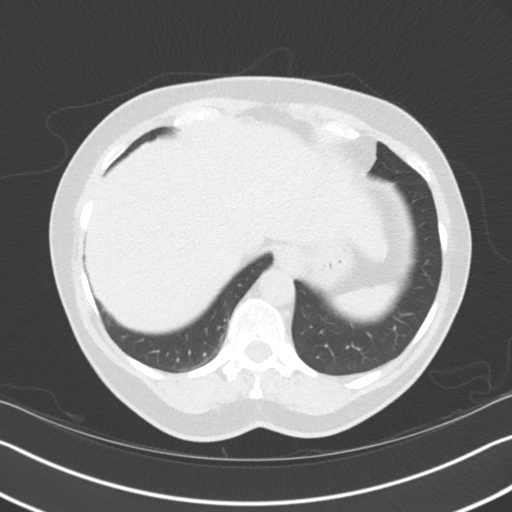
[im 20/61  lung]
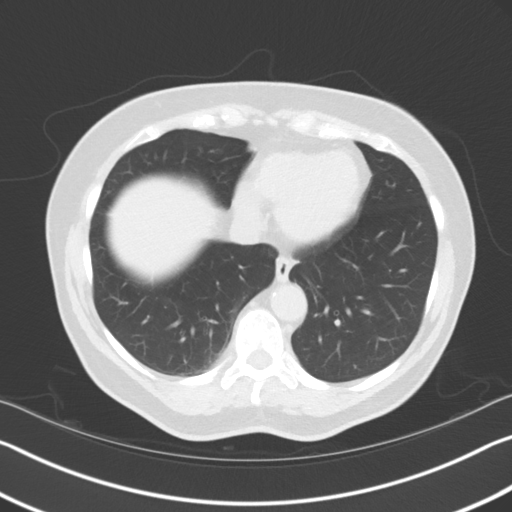
[im 25/61  mediastinal]
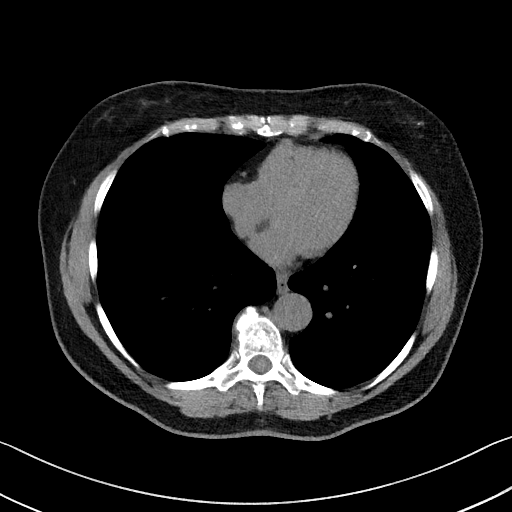
[im 25/61  lung]
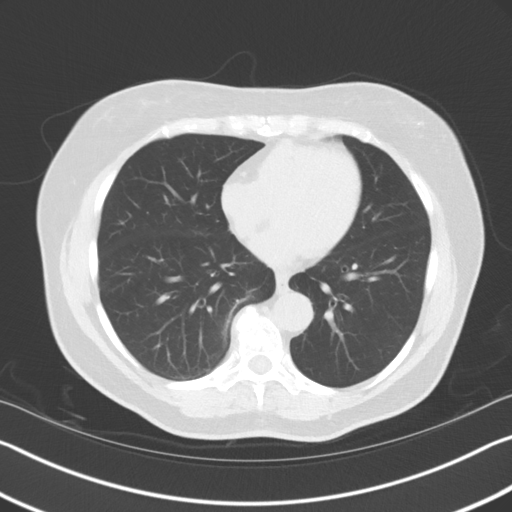
[im 29/61  lung]
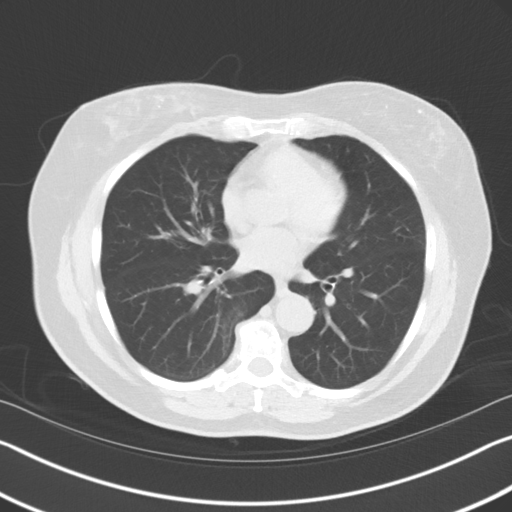
[im 34/61  lung]
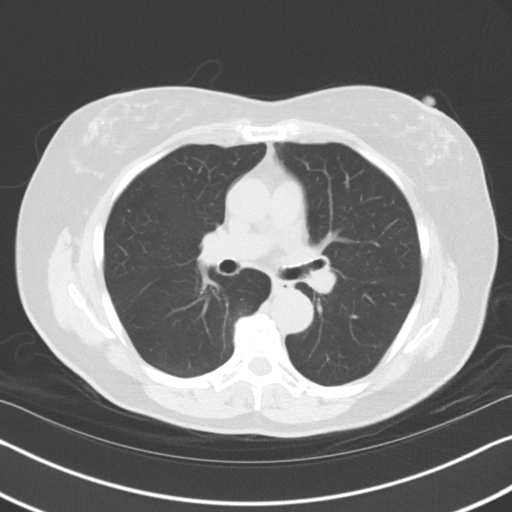
[im 39/61  lung]
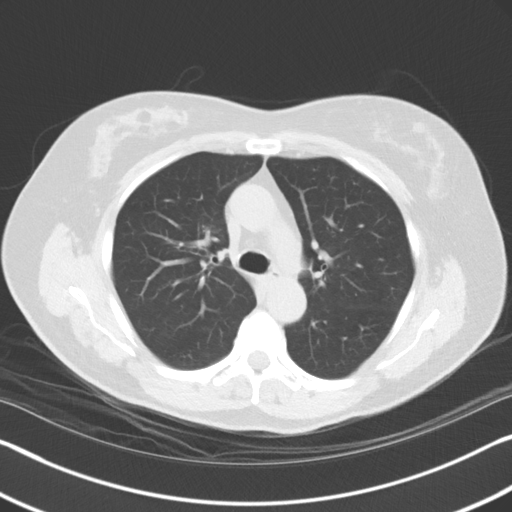
[im 44/61  mediastinal]
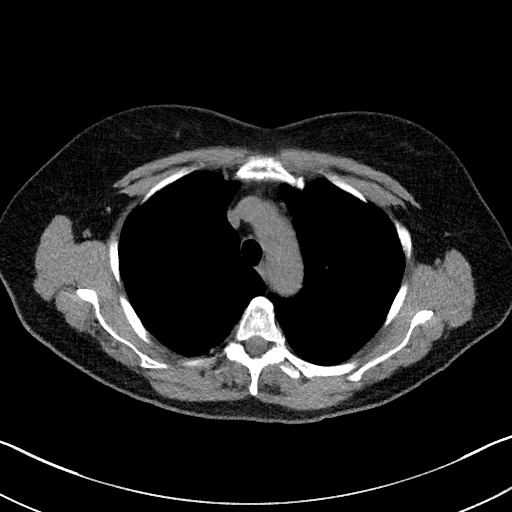
[im 44/61  lung]
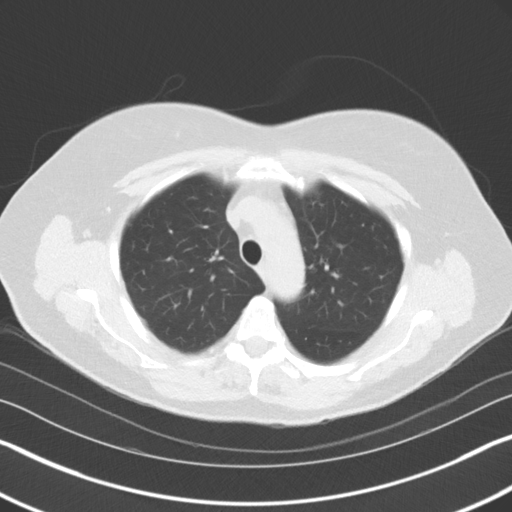
[im 49/61  lung]
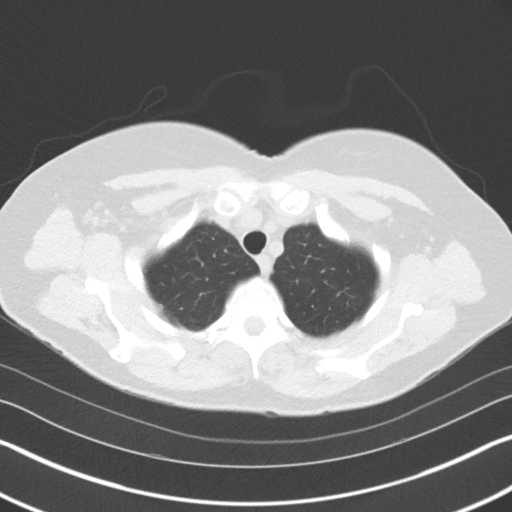
[im 53/61  lung]
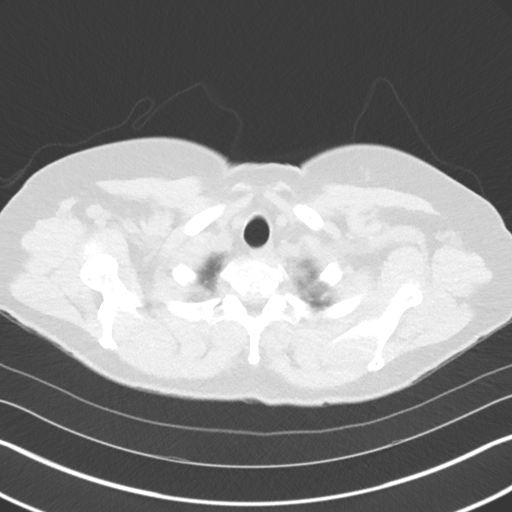
[im 58/61  lung]
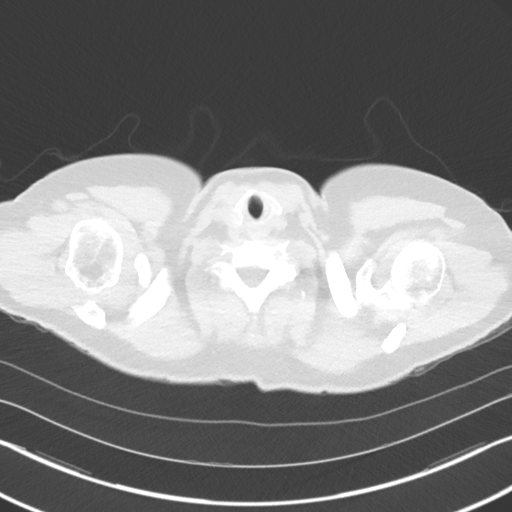

[Series 5: coronal · coronal · 0.61mm/px · 3 of 149 slices shown]
[im 30/149  lung]
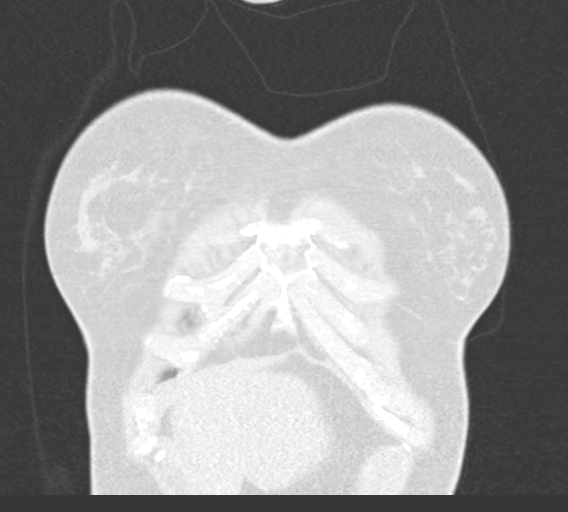
[im 60/149  lung]
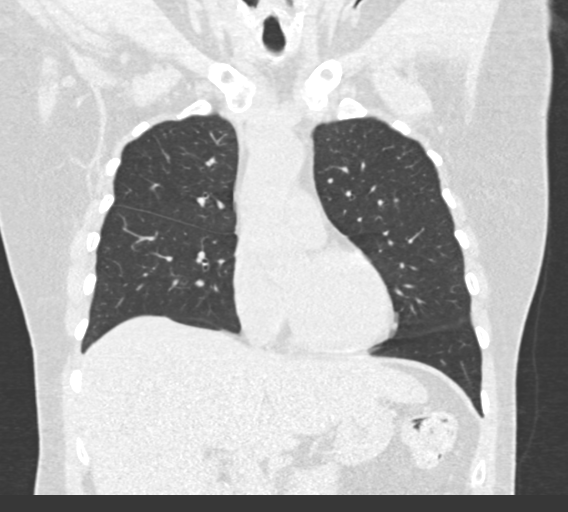
[im 89/149  lung]
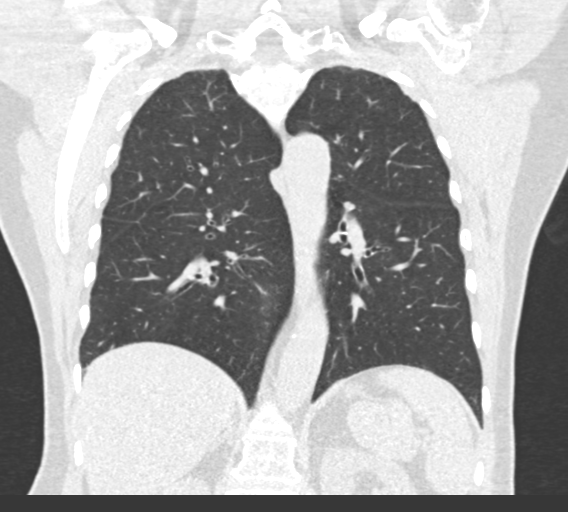

[15 of 40 positions shown; findings below may reference images not displayed]

FINDINGS: Cardiovascular: Aortic atherosclerosis. Normal heart size, without
pericardial effusion.

Mediastinum/Nodes: No mediastinal or definite hilar adenopathy,
given limitations of unenhanced CT.

Lungs/Pleura: No pleural fluid. Mild centrilobular emphysema. No
suspicious pulmonary nodule or mass.

Upper Abdomen: Mild hepatic steatosis. Lateral segment left liver
lobe 8 mm density lesion is too small to characterize but was
present back in 5881 and can be presumed benign. Normal imaged
portions of the spleen, stomach, pancreas, adrenal glands, kidneys.

Musculoskeletal: No acute osseous abnormality. Favor Schmorl's node
deformity at the T11 superior endplate, similar.
IMPRESSION: 1. Lung-RADS 1, negative. Continue annual screening with low-dose
chest CT without contrast in 12 months.
2. Aortic atherosclerosis (FAW7J-4VH.H) and emphysema (FAW7J-8RU.I).
3. Mild hepatic steatosis.

## 2019-02-11 ENCOUNTER — Ambulatory Visit
Admission: RE | Admit: 2019-02-11 | Discharge: 2019-02-11 | Disposition: A | Discharge status: Home / Self Care | Payer: Medicare Other | Source: Ambulatory Visit | Attending: Family Medicine | Admitting: Family Medicine

## 2019-02-11 ENCOUNTER — Other Ambulatory Visit: Payer: Self-pay

## 2019-02-11 DIAGNOSIS — Z1231 Encounter for screening mammogram for malignant neoplasm of breast: Secondary | ICD-10-CM

## 2019-04-27 ENCOUNTER — Other Ambulatory Visit: Payer: Self-pay | Admitting: Internal Medicine

## 2019-04-27 ENCOUNTER — Other Ambulatory Visit: Payer: Self-pay

## 2019-04-27 DIAGNOSIS — Z20822 Contact with and (suspected) exposure to covid-19: Secondary | ICD-10-CM

## 2019-04-27 DIAGNOSIS — Z20828 Contact with and (suspected) exposure to other viral communicable diseases: Secondary | ICD-10-CM

## 2019-04-28 LAB — NOVEL CORONAVIRUS, NAA: SARS-CoV-2, NAA: NOT DETECTED

## 2019-07-27 ENCOUNTER — Other Ambulatory Visit: Payer: Self-pay | Admitting: Family Medicine

## 2019-07-27 ENCOUNTER — Other Ambulatory Visit: Payer: Self-pay | Admitting: Specialist

## 2019-07-27 DIAGNOSIS — I779 Disorder of arteries and arterioles, unspecified: Secondary | ICD-10-CM

## 2019-07-31 ENCOUNTER — Ambulatory Visit
Admission: RE | Admit: 2019-07-31 | Discharge: 2019-07-31 | Disposition: A | Discharge status: Home / Self Care | Payer: Medicare Other | Source: Ambulatory Visit | Attending: Family Medicine | Admitting: Family Medicine

## 2019-07-31 DIAGNOSIS — I779 Disorder of arteries and arterioles, unspecified: Secondary | ICD-10-CM

## 2019-10-19 ENCOUNTER — Other Ambulatory Visit: Payer: Self-pay | Admitting: Family Medicine

## 2019-10-19 MED ORDER — NITROGLYCERIN 0.1 MG/HR TD PT24: MEDICATED_PATCH | TRANSDERMAL | 3 refills | Status: DC

## 2019-10-30 ENCOUNTER — Ambulatory Visit: Payer: Medicare Other

## 2019-10-31 ENCOUNTER — Ambulatory Visit: Payer: Medicare Other

## 2019-11-19 ENCOUNTER — Inpatient Hospital Stay: Admission: RE | Admit: 2019-11-19 | Payer: Medicare Other | Source: Ambulatory Visit

## 2019-11-27 ENCOUNTER — Inpatient Hospital Stay: Admission: RE | Admit: 2019-11-27 | Payer: Medicare Other | Source: Ambulatory Visit

## 2019-12-11 ENCOUNTER — Encounter (INDEPENDENT_AMBULATORY_CARE_PROVIDER_SITE_OTHER): Payer: Self-pay

## 2019-12-11 ENCOUNTER — Ambulatory Visit
Admission: RE | Admit: 2019-12-11 | Discharge: 2019-12-11 | Disposition: A | Discharge status: Home / Self Care | Payer: Medicare Other | Source: Ambulatory Visit | Attending: Acute Care | Admitting: Acute Care

## 2019-12-11 DIAGNOSIS — F17210 Nicotine dependence, cigarettes, uncomplicated: Secondary | ICD-10-CM

## 2019-12-11 DIAGNOSIS — Z122 Encounter for screening for malignant neoplasm of respiratory organs: Secondary | ICD-10-CM

## 2019-12-11 DIAGNOSIS — Z87891 Personal history of nicotine dependence: Secondary | ICD-10-CM

## 2019-12-15 NOTE — Progress Notes (Signed)
Please call patient and let them  know their  low dose Ct was read as a Lung RADS 1, negative study: no nodules or definitely benign nodules. Radiology recommendation is for a repeat LDCT in 12 months. .Please let them  know we will order and schedule their  annual screening scan for 11/2020. Please let them  know there was notation of CAD on their  scan.  Please remind the patient  that this is a non-gated exam therefore degree or severity of disease  cannot be determined. Please have them  follow up with their PCP regarding potential risk factor modification, dietary therapy or pharmacologic therapy if clinically indicated. Pt.  is currently on statin therapy. Please place order for annual  screening scan for  11/2020 and fax results to PCP. Thanks so much. 

## 2019-12-16 ENCOUNTER — Other Ambulatory Visit: Payer: Self-pay | Admitting: *Deleted

## 2019-12-16 DIAGNOSIS — F17210 Nicotine dependence, cigarettes, uncomplicated: Secondary | ICD-10-CM

## 2019-12-16 DIAGNOSIS — Z87891 Personal history of nicotine dependence: Secondary | ICD-10-CM

## 2020-02-23 ENCOUNTER — Ambulatory Visit (INDEPENDENT_AMBULATORY_CARE_PROVIDER_SITE_OTHER): Payer: Medicare Other | Admitting: Family Medicine

## 2020-02-23 ENCOUNTER — Encounter: Payer: Self-pay | Admitting: Family Medicine

## 2020-02-23 ENCOUNTER — Other Ambulatory Visit: Payer: Self-pay

## 2020-02-23 DIAGNOSIS — M65342 Trigger finger, left ring finger: Secondary | ICD-10-CM | POA: Diagnosis not present

## 2020-02-23 NOTE — Patient Instructions (Signed)
   Glucosamine Sulfate:  1,000 mg twice daily  Turmeric:  500 mg twice daily   

## 2020-02-23 NOTE — Progress Notes (Signed)
   Office Visit Note   Patient: Cheryl Burton           Date of Birth: 1945-12-31           MRN: [id number] Visit Date: 02/23/2020 Requested by: Daisy Floro, MD [address] [place],  [place] [address] PCP: Daisy Floro, MD  Subjective: No chief complaint on file.   HPI: She is here with left fourth finger pain.  For several months she has had intermittent pain in the fingers and it gets stuck in flexion.  No injury.  Pain seems to be at the palm of the hand.  She has multiple other joints bothering her as well but this is the most pressing issue.              ROS:   All other systems were reviewed and are negative.  Objective: Vital Signs: There were no vitals taken for this visit.  Physical Exam:  General:  Alert and oriented, in no acute distress. Pulm:  Breathing unlabored. Psy:  Normal mood, congruent affect. Skin: No rash Left hand: She has a tender nodule at the A1 pulley and it occasionally triggers in flexion.  Full range of motion.  Imaging: No results found.  Assessment & Plan: 1.  Left fourth trigger finger -We discussed options, she wants to try a splint intermittently.  If symptoms worsen, then injection versus hand therapy. -For her other joint pains I recommend taking glucosamine and turmeric.     Procedures: No procedures performed  No notes on file     PMFS History: Patient Active Problem List   Diagnosis Date Noted  . Chronic midline low back pain with left-sided sciatica 01/09/2017  . Pain in right hip 01/09/2017  . Trochanteric bursitis of both hips 01/09/2017   History reviewed. No pertinent past medical history.  Family History  Problem Relation Age of Onset  . Breast cancer Paternal Aunt   . Breast cancer Maternal Grandmother   . Breast cancer Paternal Aunt   . Breast cancer Paternal Aunt     History reviewed. No pertinent surgical history. Social History   Occupational History  . Not on file  Tobacco  Use  . Smoking status: Current Every Day Smoker    Packs/day: 0.90    Years: 34.00    Pack years: 30.60    Types: Cigarettes  . Smokeless tobacco: Never Used  . Tobacco comment: Smokes afew cigarettes a day based on stress  Substance and Sexual Activity  . Alcohol use: Not on file  . Drug use: Not on file  . Sexual activity: Not on file

## 2020-03-25 ENCOUNTER — Ambulatory Visit (INDEPENDENT_AMBULATORY_CARE_PROVIDER_SITE_OTHER): Payer: Medicare Other | Admitting: Family Medicine

## 2020-03-25 ENCOUNTER — Encounter: Payer: Self-pay | Admitting: Family Medicine

## 2020-03-25 ENCOUNTER — Other Ambulatory Visit: Payer: Self-pay

## 2020-03-25 DIAGNOSIS — M25562 Pain in left knee: Secondary | ICD-10-CM | POA: Diagnosis not present

## 2020-03-25 DIAGNOSIS — M65342 Trigger finger, left ring finger: Secondary | ICD-10-CM | POA: Diagnosis not present

## 2020-03-25 NOTE — Progress Notes (Signed)
   Office Visit Note   Patient: Cheryl Burton           Date of Birth: 08/26/1946           MRN: [id number] Visit Date: 03/25/2020 Requested by: Daisy Floro, MD [address] [place],  [place] [address] PCP: Daisy Floro, MD  Subjective: Chief Complaint  Patient presents with  . Left Knee - Pain    Swelling and pain x 2 weeks. Hurts to flex knee beyond 90 degrees. Pain anterior and posterior.  . Left Ring Finger - Pain    Splinting did not help. Requests injection.    HPI: She is here with left fourth finger and left knee pain.  She continues to have triggering of her fourth finger.  Splinting did not help.  It frequently gets stuck in flexion.  Her knee has been swollen for about 2 weeks.  It hurts when she kneels.  She has pain in the front and in the back.               ROS:   All other systems were reviewed and are negative.  Objective: Vital Signs: There were no vitals taken for this visit.  Physical Exam:  General:  Alert and oriented, in no acute distress. Pulm:  Breathing unlabored. Psy:  Normal mood, congruent affect. Skin: No erythema Left hand: Her fourth finger triggers in flexion and she has a tender nodule at the A1 pulley.  Full range of motion. Left knee: 2+ effusion with no warmth.  Fullness in the popliteal fossa which could indicate a cyst.  She has some tenderness on the medial and lateral joint lines.    Imaging: No results found.  Assessment & Plan: 1.  Persistent left fourth trigger finger -Injection today.  Follow-up as needed.  2.  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  Follow-up as needed.     Procedures: Left hand injection: After sterile prep with Betadine, injected 1 cc 1% lidocaine without epinephrine and 20 mg methylprednisolone into the region of the A1 pulley.  Left knee aspiration and injection: After sterile prep with Betadine,  injected 5 cc 1% lidocaine without epinephrine and then aspirated 15 cc of blood-tinged synovial fluid, then injected 40 mg methylprednisolone from superolateral approach.    PMFS History: Patient Active Problem List   Diagnosis Date Noted  . Chronic midline low back pain with left-sided sciatica 01/09/2017  . Pain in right hip 01/09/2017  . Trochanteric bursitis of both hips 01/09/2017   History reviewed. No pertinent past medical history.  Family History  Problem Relation Age of Onset  . Breast cancer Paternal Aunt   . Breast cancer Maternal Grandmother   . Breast cancer Paternal Aunt   . Breast cancer Paternal Aunt     History reviewed. No pertinent surgical history. Social History   Occupational History  . Not on file  Tobacco Use  . Smoking status: Current Every Day Smoker    Packs/day: 0.90    Years: 34.00    Pack years: 30.60    Types: Cigarettes  . Smokeless tobacco: Never Used  . Tobacco comment: Smokes afew cigarettes a day based on stress  Substance and Sexual Activity  . Alcohol use: Not on file  . Drug use: Not on file  . Sexual activity: Not on file

## 2020-04-06 ENCOUNTER — Ambulatory Visit: Payer: Medicare Other | Admitting: Family Medicine

## 2020-04-07 ENCOUNTER — Other Ambulatory Visit: Payer: Self-pay

## 2020-04-07 DIAGNOSIS — I781 Nevus, non-neoplastic: Secondary | ICD-10-CM

## 2020-04-20 ENCOUNTER — Encounter (HOSPITAL_COMMUNITY): Payer: Medicare Other

## 2020-05-10 ENCOUNTER — Other Ambulatory Visit: Payer: Self-pay | Admitting: Family Medicine

## 2020-05-10 DIAGNOSIS — Z1231 Encounter for screening mammogram for malignant neoplasm of breast: Secondary | ICD-10-CM

## 2020-05-17 ENCOUNTER — Ambulatory Visit: Payer: Medicare Other

## 2020-05-31 ENCOUNTER — Ambulatory Visit
Admission: RE | Admit: 2020-05-31 | Discharge: 2020-05-31 | Disposition: A | Discharge status: Home / Self Care | Payer: Medicare Other | Source: Ambulatory Visit | Attending: Family Medicine | Admitting: Family Medicine

## 2020-05-31 ENCOUNTER — Other Ambulatory Visit: Payer: Self-pay

## 2020-05-31 DIAGNOSIS — Z1231 Encounter for screening mammogram for malignant neoplasm of breast: Secondary | ICD-10-CM

## 2020-07-01 ENCOUNTER — Other Ambulatory Visit: Payer: Self-pay | Admitting: Endocrinology

## 2020-07-01 DIAGNOSIS — E039 Hypothyroidism, unspecified: Secondary | ICD-10-CM

## 2020-07-12 ENCOUNTER — Other Ambulatory Visit: Payer: Medicare Other

## 2020-07-13 ENCOUNTER — Ambulatory Visit
Admission: RE | Admit: 2020-07-13 | Discharge: 2020-07-13 | Disposition: A | Discharge status: Home / Self Care | Payer: Medicare Other | Source: Ambulatory Visit | Attending: Endocrinology | Admitting: Endocrinology

## 2020-07-13 DIAGNOSIS — E039 Hypothyroidism, unspecified: Secondary | ICD-10-CM

## 2020-09-07 ENCOUNTER — Other Ambulatory Visit: Payer: Self-pay | Admitting: Family Medicine

## 2020-09-07 DIAGNOSIS — M8580 Other specified disorders of bone density and structure, unspecified site: Secondary | ICD-10-CM

## 2020-10-03 ENCOUNTER — Ambulatory Visit: Payer: Medicare Other | Admitting: Family Medicine

## 2020-10-10 ENCOUNTER — Ambulatory Visit: Payer: Medicare Other | Admitting: Family Medicine

## 2020-10-20 ENCOUNTER — Ambulatory Visit (INDEPENDENT_AMBULATORY_CARE_PROVIDER_SITE_OTHER): Payer: Medicare Other | Admitting: Family Medicine

## 2020-10-20 ENCOUNTER — Other Ambulatory Visit: Payer: Self-pay

## 2020-10-20 DIAGNOSIS — M65331 Trigger finger, right middle finger: Secondary | ICD-10-CM | POA: Diagnosis not present

## 2020-10-20 DIAGNOSIS — M65341 Trigger finger, right ring finger: Secondary | ICD-10-CM

## 2020-10-20 NOTE — Progress Notes (Signed)
   Office Visit Note   Patient: Leeann Bady           Date of Birth: 1946-03-19           MRN: [id number] Visit Date: 10/20/2020 Requested by: Daisy Floro, MD [address] [place],  [place] [address] PCP: Daisy Floro, MD  Subjective: Chief Complaint  Patient presents with  . Right Hand - Pain    Painful trigger fingers, middle and ring fingers. Requests injections.    HPI: She is here with right hand third and fourth trigger fingers.  Left hand has done well since injection in July.  Her right fourth finger started triggering consistently a few weeks ago.  The third finger is sore but is not triggering regularly.                ROS:   All other systems were reviewed and are negative.  Objective: Vital Signs: There were no vitals taken for this visit.  Physical Exam:  General:  Alert and oriented, in no acute distress. Pulm:  Breathing unlabored. Psy:  Normal mood, congruent affect. Skin: No erythema Right hand: There is tenderness at the A1 pulley of the third and fourth fingers.  The fourth finger triggers in flexion.  Full range of motion.    Imaging: No results found.  Assessment & Plan: 1.  Right third and fourth trigger fingers -Discussed with her and elected to inject the fourth trigger finger today.  Could do the third later if needed.     Procedures: Right hand injection: After sterile prep with Betadine, injected 1 cc 0.25% bupivacaine and 3 mg betamethasone into the region of the A1 pulley of the fourth finger.       PMFS History: Patient Active Problem List   Diagnosis Date Noted  . Chronic midline low back pain with left-sided sciatica 01/09/2017  . Pain in right hip 01/09/2017  . Trochanteric bursitis of both hips 01/09/2017   No past medical history on file.  Family History  Problem Relation Age of Onset  . Breast cancer Paternal Aunt   . Breast cancer Maternal Grandmother   . Breast cancer Paternal Aunt   .  Breast cancer Paternal Aunt     No past surgical history on file. Social History   Occupational History  . Not on file  Tobacco Use  . Smoking status: Current Every Day Smoker    Packs/day: 0.90    Years: 34.00    Pack years: 30.60    Types: Cigarettes  . Smokeless tobacco: Never Used  . Tobacco comment: Smokes afew cigarettes a day based on stress  Substance and Sexual Activity  . Alcohol use: Not on file  . Drug use: Not on file  . Sexual activity: Not on file

## 2020-12-13 ENCOUNTER — Ambulatory Visit
Admission: RE | Admit: 2020-12-13 | Discharge: 2020-12-13 | Disposition: A | Discharge status: Home / Self Care | Payer: Medicare Other | Source: Ambulatory Visit | Attending: Acute Care | Admitting: Acute Care

## 2020-12-13 DIAGNOSIS — Z87891 Personal history of nicotine dependence: Secondary | ICD-10-CM

## 2020-12-13 DIAGNOSIS — F17210 Nicotine dependence, cigarettes, uncomplicated: Secondary | ICD-10-CM

## 2020-12-19 ENCOUNTER — Telehealth: Payer: Self-pay | Admitting: Acute Care

## 2020-12-19 DIAGNOSIS — Z87891 Personal history of nicotine dependence: Secondary | ICD-10-CM

## 2020-12-19 DIAGNOSIS — F17210 Nicotine dependence, cigarettes, uncomplicated: Secondary | ICD-10-CM

## 2020-12-19 NOTE — Telephone Encounter (Signed)
Pt informed of CT results per Sarah Groce, NP.  PT verbalized understanding.  Copy sent to PCP.  Order placed for 1 yr f/u CT.  

## 2020-12-21 ENCOUNTER — Other Ambulatory Visit: Payer: Medicare Other

## 2020-12-29 NOTE — Progress Notes (Signed)
Results called to the patient 12/19/2020 by Abigail Miyamoto RN.  Lung RADS 1, negative study: no nodules or definitely benign nodules. Radiology recommendation is for a repeat LDCT in 12 months.   Pt informed of CT results per Kandice Robinsons, NP.  PT verbalized understanding.  Copy sent to PCP.  Order placed for 1 yr f/u CT.

## 2021-01-30 ENCOUNTER — Encounter: Payer: Self-pay | Admitting: Family Medicine

## 2021-01-30 ENCOUNTER — Ambulatory Visit (INDEPENDENT_AMBULATORY_CARE_PROVIDER_SITE_OTHER): Payer: Medicare Other | Admitting: Family Medicine

## 2021-01-30 ENCOUNTER — Other Ambulatory Visit: Payer: Self-pay

## 2021-01-30 DIAGNOSIS — M65341 Trigger finger, right ring finger: Secondary | ICD-10-CM

## 2021-01-30 DIAGNOSIS — M65342 Trigger finger, left ring finger: Secondary | ICD-10-CM

## 2021-01-30 DIAGNOSIS — M65331 Trigger finger, right middle finger: Secondary | ICD-10-CM

## 2021-01-30 MED ORDER — DICLOFENAC SODIUM 1 % EX GEL: 4.0000 g | Freq: Four times a day (QID) | CUTANEOUS | 6 refills | Status: AC | PRN

## 2021-01-30 MED ORDER — PREDNISONE 10 MG PO TABS: ORAL_TABLET | ORAL | 0 refills | Status: DC

## 2021-01-30 NOTE — Progress Notes (Signed)
   Office Visit Note   Patient: Cheryl Burton           Date of Birth: [birth year]           MRN: [id number] Visit Date: 01/30/2021 Requested by: Daisy Floro, MD [address] [place],  [place] [address] PCP: Daisy Floro, MD  Subjective: Chief Complaint  Patient presents with  . Right Hand - Pain    Right trigger index finger  Right hand is worse  Sensitive to touch +Burning  +Right handed +Ibuprofren twice a day. Would like possible injection today if appropriate.   . Left Hand - Pain    Left Trigger index finger      HPI: She is here with bilateral hand pain.  Right 1 hurts more than the left.  Trigger finger injections in February helped a little bit but not completely.  Both hands are getting very stiff, right hand hurts especially near the A1 pulleys of the index, third and fourth fingers.  Left 1 hurts in the same area but not as badly.  She has been to a rheumatologist who took x-rays and did a bunch of labs and told her that she had osteoarthritis rather than rheumatoid.                ROS:   All other systems were reviewed and are negative.  Objective: Vital Signs: There were no vitals taken for this visit.  Physical Exam:  General:  Alert and oriented, in no acute distress. Pulm:  Breathing unlabored. Psy:  Normal mood, congruent affect. Skin: Slight erythema across the palm of her hands. Hands: There is some early stiffness of both hands with flexion at the MCP joints in the second through fourth fingers.  Tender nodule at the A1 pulley of the right fourth finger and a little bit tender at the second and third of both hands.  No active triggering today.    Imaging: No results found.  Assessment & Plan: 1.  Bilateral hand pain with trigger fingers, still cannot rule out rheumatologic disease. -Prednisone, Voltaren gel topically.  PT and hand referral.  Could inject with steroid again if symptoms persist.     Procedures: No procedures  performed        PMFS History: Patient Active Problem List   Diagnosis Date Noted  . Chronic midline low back pain with left-sided sciatica 01/09/2017  . Pain in right hip 01/09/2017  . Trochanteric bursitis of both hips 01/09/2017   History reviewed. No pertinent past medical history.  Family History  Problem Relation Age of Onset  . Breast cancer Paternal Aunt   . Breast cancer Maternal Grandmother   . Breast cancer Paternal Aunt   . Breast cancer Paternal Aunt     History reviewed. No pertinent surgical history. Social History   Occupational History  . Not on file  Tobacco Use  . Smoking status: Current Every Day Smoker    Packs/day: 0.90    Years: 34.00    Pack years: 30.60    Types: Cigarettes  . Smokeless tobacco: Never Used  . Tobacco comment: Smokes afew cigarettes a day based on stress  Substance and Sexual Activity  . Alcohol use: Not on file  . Drug use: Not on file  . Sexual activity: Not on file

## 2021-02-02 ENCOUNTER — Other Ambulatory Visit: Payer: Self-pay | Admitting: Family Medicine

## 2021-02-14 ENCOUNTER — Ambulatory Visit: Payer: Medicare Other | Admitting: Family Medicine

## 2021-05-23 ENCOUNTER — Other Ambulatory Visit: Payer: Self-pay | Admitting: Family Medicine

## 2021-05-23 DIAGNOSIS — Z1231 Encounter for screening mammogram for malignant neoplasm of breast: Secondary | ICD-10-CM

## 2021-06-02 ENCOUNTER — Other Ambulatory Visit: Payer: Self-pay

## 2021-06-02 ENCOUNTER — Ambulatory Visit
Admission: RE | Admit: 2021-06-02 | Discharge: 2021-06-02 | Disposition: A | Discharge status: Home / Self Care | Payer: Medicare Other | Source: Ambulatory Visit | Attending: Family Medicine | Admitting: Family Medicine

## 2021-06-02 DIAGNOSIS — M8580 Other specified disorders of bone density and structure, unspecified site: Secondary | ICD-10-CM

## 2021-06-05 ENCOUNTER — Ambulatory Visit: Payer: Medicare Other

## 2021-07-03 ENCOUNTER — Ambulatory Visit
Admission: RE | Admit: 2021-07-03 | Discharge: 2021-07-03 | Disposition: A | Discharge status: Home / Self Care | Payer: Medicare Other | Source: Ambulatory Visit | Attending: Family Medicine | Admitting: Family Medicine

## 2021-07-03 ENCOUNTER — Other Ambulatory Visit: Payer: Self-pay

## 2021-07-03 DIAGNOSIS — Z1231 Encounter for screening mammogram for malignant neoplasm of breast: Secondary | ICD-10-CM

## 2022-02-01 ENCOUNTER — Other Ambulatory Visit: Payer: Self-pay | Admitting: *Deleted

## 2022-02-01 DIAGNOSIS — Z122 Encounter for screening for malignant neoplasm of respiratory organs: Secondary | ICD-10-CM

## 2022-02-01 DIAGNOSIS — Z87891 Personal history of nicotine dependence: Secondary | ICD-10-CM

## 2022-02-01 DIAGNOSIS — F17210 Nicotine dependence, cigarettes, uncomplicated: Secondary | ICD-10-CM

## 2022-02-20 ENCOUNTER — Ambulatory Visit
Admission: RE | Admit: 2022-02-20 | Discharge: 2022-02-20 | Disposition: A | Discharge status: Home / Self Care | Payer: Medicare Other | Source: Ambulatory Visit | Attending: Acute Care | Admitting: Acute Care

## 2022-02-20 DIAGNOSIS — Z122 Encounter for screening for malignant neoplasm of respiratory organs: Secondary | ICD-10-CM

## 2022-02-20 DIAGNOSIS — F17210 Nicotine dependence, cigarettes, uncomplicated: Secondary | ICD-10-CM

## 2022-02-20 DIAGNOSIS — Z87891 Personal history of nicotine dependence: Secondary | ICD-10-CM

## 2022-02-22 ENCOUNTER — Other Ambulatory Visit: Payer: Self-pay

## 2022-02-22 DIAGNOSIS — Z122 Encounter for screening for malignant neoplasm of respiratory organs: Secondary | ICD-10-CM

## 2022-02-22 DIAGNOSIS — Z87891 Personal history of nicotine dependence: Secondary | ICD-10-CM

## 2022-02-22 DIAGNOSIS — F17210 Nicotine dependence, cigarettes, uncomplicated: Secondary | ICD-10-CM

## 2022-06-11 ENCOUNTER — Other Ambulatory Visit: Payer: Self-pay | Admitting: Family Medicine

## 2022-06-18 ENCOUNTER — Other Ambulatory Visit: Payer: Self-pay | Admitting: Endocrinology

## 2022-06-18 ENCOUNTER — Other Ambulatory Visit: Payer: Self-pay | Admitting: Family Medicine

## 2022-06-18 DIAGNOSIS — E049 Nontoxic goiter, unspecified: Secondary | ICD-10-CM

## 2022-06-19 ENCOUNTER — Other Ambulatory Visit: Payer: Self-pay | Admitting: Family Medicine

## 2022-06-19 DIAGNOSIS — N644 Mastodynia: Secondary | ICD-10-CM

## 2022-06-26 ENCOUNTER — Ambulatory Visit
Admission: RE | Admit: 2022-06-26 | Discharge: 2022-06-26 | Disposition: A | Discharge status: Home / Self Care | Payer: Medicare Other | Source: Ambulatory Visit | Attending: Endocrinology | Admitting: Endocrinology

## 2022-06-26 DIAGNOSIS — E049 Nontoxic goiter, unspecified: Secondary | ICD-10-CM

## 2022-07-14 ENCOUNTER — Ambulatory Visit
Admission: RE | Admit: 2022-07-14 | Discharge: 2022-07-14 | Disposition: A | Discharge status: Home / Self Care | Payer: Medicare Other | Source: Ambulatory Visit | Attending: Family Medicine | Admitting: Family Medicine

## 2022-07-14 ENCOUNTER — Other Ambulatory Visit: Payer: Medicare Other

## 2022-07-14 DIAGNOSIS — N644 Mastodynia: Secondary | ICD-10-CM

## 2022-07-25 ENCOUNTER — Encounter: Payer: Self-pay | Admitting: Gastroenterology

## 2022-08-30 ENCOUNTER — Ambulatory Visit: Payer: Medicare Other | Admitting: Gastroenterology

## 2022-12-18 LAB — HEMOGLOBIN A1C: A1c: 6.6

## 2022-12-18 LAB — COMPREHENSIVE METABOLIC PANEL: EGFR: 59

## 2022-12-31 ENCOUNTER — Other Ambulatory Visit (HOSPITAL_COMMUNITY): Payer: Self-pay | Admitting: Family Medicine

## 2022-12-31 ENCOUNTER — Ambulatory Visit (HOSPITAL_COMMUNITY)
Admission: RE | Admit: 2022-12-31 | Discharge: 2022-12-31 | Disposition: A | Discharge status: Home / Self Care | Payer: Medicare Other | Source: Ambulatory Visit | Attending: Surgery | Admitting: Surgery

## 2022-12-31 DIAGNOSIS — I779 Disorder of arteries and arterioles, unspecified: Secondary | ICD-10-CM

## 2023-02-22 ENCOUNTER — Ambulatory Visit (HOSPITAL_COMMUNITY)
Admission: RE | Admit: 2023-02-22 | Discharge: 2023-02-22 | Disposition: A | Discharge status: Home / Self Care | Payer: Medicare Other | Source: Ambulatory Visit | Attending: Family Medicine | Admitting: Family Medicine

## 2023-02-22 DIAGNOSIS — F17210 Nicotine dependence, cigarettes, uncomplicated: Secondary | ICD-10-CM

## 2023-02-22 DIAGNOSIS — J439 Emphysema, unspecified: Secondary | ICD-10-CM | POA: Diagnosis not present

## 2023-02-22 DIAGNOSIS — I700 Atherosclerosis of aorta: Secondary | ICD-10-CM | POA: Insufficient documentation

## 2023-02-22 DIAGNOSIS — Z87891 Personal history of nicotine dependence: Secondary | ICD-10-CM

## 2023-02-22 DIAGNOSIS — Z122 Encounter for screening for malignant neoplasm of respiratory organs: Secondary | ICD-10-CM | POA: Diagnosis present

## 2023-03-01 ENCOUNTER — Other Ambulatory Visit: Payer: Self-pay | Admitting: Acute Care

## 2023-03-01 DIAGNOSIS — Z87891 Personal history of nicotine dependence: Secondary | ICD-10-CM

## 2023-03-01 DIAGNOSIS — F17210 Nicotine dependence, cigarettes, uncomplicated: Secondary | ICD-10-CM

## 2023-03-01 DIAGNOSIS — Z122 Encounter for screening for malignant neoplasm of respiratory organs: Secondary | ICD-10-CM

## 2023-05-29 ENCOUNTER — Ambulatory Visit: Payer: Medicare Other | Admitting: Cardiology

## 2023-06-03 ENCOUNTER — Encounter: Payer: Self-pay | Admitting: Internal Medicine

## 2023-06-03 ENCOUNTER — Ambulatory Visit: Payer: Medicare Other | Attending: Cardiology | Admitting: Internal Medicine

## 2023-06-03 VITALS — BP 108/60 | HR 74 | Ht 60.0 in | Wt 144.8 lb

## 2023-06-03 DIAGNOSIS — I779 Disorder of arteries and arterioles, unspecified: Secondary | ICD-10-CM | POA: Diagnosis not present

## 2023-06-03 DIAGNOSIS — Z79899 Other long term (current) drug therapy: Secondary | ICD-10-CM | POA: Diagnosis not present

## 2023-06-03 MED ORDER — ROSUVASTATIN CALCIUM 20 MG PO TABS: 20.0000 mg | ORAL_TABLET | Freq: Every day | ORAL | 3 refills | Status: DC

## 2023-06-03 NOTE — Progress Notes (Signed)
Cardiology Office Note   Date:  06/03/2023   ID:  Cheryl Burton, DOB March 12, 1946, MRN [id number]  PCP:  Daisy Floro, MD  Cardiologist:   Dietrich Pates, MD   Pt referred for evaluation of atherosclerosis    History of Present Illness: Cheryl Burton is a 77 y.o. female with a history of HTN, HL, GERD, hypothyroids, COPD, CV dz   Followed by Vianne Bulls  CT of chest in June 2024 showed atherosclerosis of aorta     The pt notes occasional episgastric pains  Not associated with activity    Occurs every now and again.   No associated SOB   has a hx of reflux    Overall breathing is OK    Takes careof her home   Doesn't walk much outside    Daughter had a "widow maker" at 2   Has a clotting disorder ([facility]   No name for it)   Pt has had no clots   Diet Br  Skips  Water or coffee (vanilla creamer) Lunch:  Apple or piece  of cheese   Water  Dinner:  Veggies and Pension scheme manager   Popcorn  Current Meds  Medication Sig   albuterol (VENTOLIN HFA) 108 (90 Base) MCG/ACT inhaler Inhale 2 puffs into the lungs every 6 (six) hours as needed for wheezing or shortness of breath.   ALPRAZolam (XANAX) 0.25 MG tablet Take 0.25 mg by mouth as needed for anxiety or sleep.   amLODipine (NORVASC) 5 MG tablet Take 5 mg by mouth daily.   Calcium Carbonate-Vit D-Min (CALCIUM 1200 PO) Take by mouth daily.   Cholecalciferol (VITAMIN D3) 50 MCG (2000 UT) capsule Take 2,000 Units by mouth daily.   diclofenac Sodium (VOLTAREN) 1 % GEL Apply 4 g topically 4 (four) times daily as needed.   etodolac (LODINE) 400 MG tablet    irbesartan (AVAPRO) 150 MG tablet Take 150 mg by mouth daily.   levothyroxine (SYNTHROID, LEVOTHROID) 88 MCG tablet Take 88 mcg by mouth daily.   Multiple Vitamin (MULTIVITAMIN) tablet Take 1 tablet by mouth daily.   naproxen (NAPROSYN) 250 MG tablet Take 250 mg by mouth 2 (two) times daily with a meal.   nitroGLYCERIN (NITRODUR - DOSED IN MG/24 HR) 0.1 mg/hr patch APPLY  1/4 PATCH TO AFFECTED AREA 12 HOURS DAILY   Omega-3 Fatty Acids (FISH OIL) 1200 MG CAPS Take by mouth daily.   pantoprazole (PROTONIX) 40 MG tablet Take 40 mg by mouth as needed.   rosuvastatin (CRESTOR) 10 MG tablet Take 10 mg by mouth daily.   venlafaxine XR (EFFEXOR-XR) 150 MG 24 hr capsule Take 150 mg by mouth daily. with food   [DISCONTINUED] denosumab (PROLIA) 60 MG/ML SOSY injection Inject 60 mg into the skin every 6 (six) months.   [DISCONTINUED] predniSONE (DELTASONE) 10 MG tablet Take as directed for 12 days.  Daily dose 6,6,5,5,4,4,3,3,2,2,1,1.     Allergies:   Codeine and Amoxicillin-pot clavulanate   Past Medical History:  Diagnosis Date   Anxiety    Aortic atherosclerosis (HCC)    Borderline diabetes    Carotid arterial disease (HCC)    Chronic kidney disease, stage 3a (HCC)    Emphysema lung (HCC)    Emphysema, unspecified (HCC)    GERD (gastroesophageal reflux disease)    Goiter diffuse    History of pancreatitis    HTN (hypertension)    Hyperlipidemia    Hypothyroidism    Memory  difficulties    Multiple fractures    OA (osteoarthritis)    Osteoporosis    Otitis externa     History reviewed. No pertinent surgical history.   Social History:  The patient  reports that she has been smoking cigarettes. She has a 30.6 pack-year smoking history. She has never used smokeless tobacco.   Family History:  The patient's family history includes Breast cancer in her maternal grandmother, paternal aunt, paternal aunt, and paternal aunt.    ROS:  Please see the history of present illness. All other systems are reviewed and  Negative to the above problem except as noted.    PHYSICAL EXAM: VS:  BP 108/60   Pulse 74   Ht 5' (1.524 m)   Wt 144 lb 12.8 oz (65.7 kg)   SpO2 95%   BMI 28.28 kg/m   GEN: Well nourished, well developed, in no acute distress  HEENT: normal  Neck: no JVD, no carotid bruit Cardiac: RRR; no murmurs No LE  edema  Respiratory:  clear to  auscultation  GI: soft, nontender,  No hepatomegaly   EKG:  EKG is ordered today.  NSR 74 bpm  LAFB      Lipid Panel No results found for: "CHOL", "TRIG", "HDL", "CHOLHDL", "VLDL", "LDLCALC", "LDLDIRECT"    Wt Readings from Last 3 Encounters:  06/03/23 144 lb 12.8 oz (65.7 kg)  01/09/17 137 lb (62.1 kg)      ASSESSMENT AND PLAN:  1  Atherosclerosis    Pt with CT showing atherosclerosis of aorta    Would recomm risk factor modification. Chest discomfort episodes are atypical for angina  Sound more GI  2  HL  LDL is 80  HDL 38  Trig 205  Reviewed diet   Limit processed foods, carbs  (A1C 6.6)   I would increase Crestor to 20 mg daily  Follow u lipomed and Apob B in 12 wks    Check A1C at that time   3  BP is controlled    Follow      Follow up in 1 year in clinic    Current medicines are reviewed at length with the patient today.  The patient does not have concerns regarding medicines.  Signed, Dietrich Pates, MD  06/03/2023 2:16 PM    [facility] [address] [place], [place], [place]  [address] Phone: [phone]; Fax: [phone]

## 2023-06-03 NOTE — Patient Instructions (Addendum)
Medication Instructions:  Your physician has recommended you make the following change in your medication:   1) INCREASE rosuvastatin (Crestor) 20 mg daily  *If you need a refill on your cardiac medications before your next appointment, please call your pharmacy*  Lab Work: In 12 weeks: Lipomed profile, LFTs, Apo B, Hgb A1c If you have labs (blood work) drawn today and your tests are completely normal, you will receive your results only by: MyChart Message (if you have MyChart) OR A paper copy in the mail If you have any lab test that is abnormal or we need to change your treatment, we will call you to review the results.  Testing/Procedures: None ordered today.  Follow-Up: At [facility], you and your health needs are our priority.  As part of our continuing mission to provide you with exceptional heart care, we have created designated Provider Care Teams.  These Care Teams include your primary Cardiologist (physician) and Advanced Practice Providers (APPs -  Physician Assistants and Nurse Practitioners) who all work together to provide you with the care you need, when you need it.  Your next appointment:   12 month(s)  The format for your next appointment:   In Person  Provider:   Dietrich Pates, MD {

## 2023-08-05 ENCOUNTER — Other Ambulatory Visit: Payer: Self-pay | Admitting: Endocrinology

## 2023-08-05 DIAGNOSIS — E049 Nontoxic goiter, unspecified: Secondary | ICD-10-CM

## 2023-08-07 ENCOUNTER — Ambulatory Visit
Admission: RE | Admit: 2023-08-07 | Discharge: 2023-08-07 | Disposition: A | Discharge status: Home / Self Care | Payer: Medicare Other | Source: Ambulatory Visit | Attending: Endocrinology | Admitting: Endocrinology

## 2023-08-07 DIAGNOSIS — E049 Nontoxic goiter, unspecified: Secondary | ICD-10-CM

## 2023-08-23 ENCOUNTER — Encounter: Payer: Self-pay | Admitting: *Deleted

## 2023-08-26 ENCOUNTER — Ambulatory Visit: Payer: Medicare Other

## 2023-08-26 DIAGNOSIS — I779 Disorder of arteries and arterioles, unspecified: Secondary | ICD-10-CM

## 2023-08-26 DIAGNOSIS — Z79899 Other long term (current) drug therapy: Secondary | ICD-10-CM

## 2023-08-28 LAB — HEPATIC FUNCTION PANEL
ALT: 22 [IU]/L (ref 0–32)
AST: 22 [IU]/L (ref 0–40)
Albumin: 4.7 g/dL (ref 3.8–4.8)
Alkaline Phosphatase: 66 [IU]/L (ref 44–121)
Bilirubin Total: 0.4 mg/dL (ref 0.0–1.2)
Bilirubin, Direct: 0.15 mg/dL (ref 0.00–0.40)
Total Protein: 7.2 g/dL (ref 6.0–8.5)

## 2023-08-28 LAB — NMR, LIPOPROFILE
Cholesterol, Total: 124 mg/dL (ref 100–199)
HDL Particle Number: 35.7 umol/L (ref >=30.5)
HDL-C: 42 mg/dL (ref >39)
LDL Particle Number: 904 nmol/L (ref <1000)
LDL Size: 19.9 nm — ABNORMAL LOW (ref >20.5)
LDL-C (NIH Calc): 62 mg/dL (ref 0–99)
LP-IR Score: 70 — ABNORMAL HIGH (ref <=45)
Small LDL Particle Number: 695 nmol/L — ABNORMAL HIGH (ref <=527)
Triglycerides: 110 mg/dL (ref 0–149)

## 2023-08-28 LAB — HEMOGLOBIN A1C
Est. average glucose Bld gHb Est-mCnc: 137 mg/dL
Hgb A1c MFr Bld: 6.4 % — ABNORMAL HIGH (ref 4.8–5.6)

## 2023-08-28 LAB — APOLIPOPROTEIN B: Apolipoprotein B: 62 mg/dL (ref <90)

## 2023-08-29 ENCOUNTER — Other Ambulatory Visit: Payer: Self-pay

## 2023-09-17 ENCOUNTER — Ambulatory Visit (HOSPITAL_BASED_OUTPATIENT_CLINIC_OR_DEPARTMENT_OTHER): Payer: Medicare Other

## 2023-09-17 ENCOUNTER — Ambulatory Visit (HOSPITAL_BASED_OUTPATIENT_CLINIC_OR_DEPARTMENT_OTHER): Payer: Medicare Other | Admitting: Student

## 2023-09-17 ENCOUNTER — Encounter (HOSPITAL_BASED_OUTPATIENT_CLINIC_OR_DEPARTMENT_OTHER): Payer: Self-pay | Admitting: Student

## 2023-09-17 DIAGNOSIS — M79641 Pain in right hand: Secondary | ICD-10-CM

## 2023-09-17 DIAGNOSIS — M79642 Pain in left hand: Secondary | ICD-10-CM | POA: Diagnosis not present

## 2023-09-17 DIAGNOSIS — M19041 Primary osteoarthritis, right hand: Secondary | ICD-10-CM

## 2023-09-17 DIAGNOSIS — M19042 Primary osteoarthritis, left hand: Secondary | ICD-10-CM | POA: Diagnosis not present

## 2023-09-17 NOTE — Progress Notes (Signed)
 Chief Complaint: Pain in bilateral hands     History of Present Illness:    Cheryl Burton is a 77 y.o. right-hand-dominant female here today for evaluation of pain in both hands.  States that she does have known osteoarthritis in both hands but pain has worsened over the last 5 days.  Rates this at an 8/10 today.  Pain has been worse at night and she has been unable to form a fist in either hand.  Has tried heat, bracing, and naproxen.   Surgical History:   None  PMH/PSH/Family History/Social History/Meds/Allergies:    Past Medical History:  Diagnosis Date   Anxiety    Aortic atherosclerosis (HCC)    Borderline diabetes    Carotid arterial disease (HCC)    Chronic kidney disease, stage 3a (HCC)    Emphysema lung (HCC)    Emphysema, unspecified (HCC)    GERD (gastroesophageal reflux disease)    Goiter diffuse    History of pancreatitis    HTN (hypertension)    Hyperlipidemia    Hypothyroidism    Memory difficulties    Multiple fractures    OA (osteoarthritis)    Osteoporosis    Otitis externa    History reviewed. No pertinent surgical history. Social History   Socioeconomic History   Marital status: Widowed    Spouse name: Not on file   Number of children: Not on file   Years of education: Not on file   Highest education level: Not on file  Occupational History   Not on file  Tobacco Use   Smoking status: Every Day    Current packs/day: 0.90    Average packs/day: 0.9 packs/day for 34.0 years (30.6 ttl pk-yrs)    Types: Cigarettes   Smokeless tobacco: Never   Tobacco comments:    Smokes afew cigarettes a day based on stress  Substance and Sexual Activity   Alcohol use: Not on file   Drug use: Not on file   Sexual activity: Not on file  Other Topics Concern   Not on file  Social History Narrative   Not on file   Social Drivers of Health   Financial Resource Strain: Not on file  Food Insecurity: Not on file   Transportation Needs: Not on file  Physical Activity: Not on file  Stress: Not on file  Social Connections: Not on file   Family History  Problem Relation Age of Onset   Heart attack Mother    Cancer Father    Breast cancer Paternal Aunt        45s   Breast cancer Paternal Aunt        23s   Breast cancer Paternal Aunt        78s   Breast cancer Maternal Grandmother        92s   Heart attack Daughter    Allergies  Allergen Reactions   Codeine    Amoxicillin-Pot Clavulanate Other (See Comments)   Current Outpatient Medications  Medication Sig Dispense Refill   albuterol (VENTOLIN HFA) 108 (90 Base) MCG/ACT inhaler Inhale 2 puffs into the lungs every 6 (six) hours as needed for wheezing or shortness of breath.     ALPRAZolam (XANAX) 0.25 MG tablet Take 0.25 mg by mouth as needed for anxiety or sleep.  0   amLODipine (  NORVASC) 5 MG tablet Take 5 mg by mouth daily.  4   Calcium  Carbonate-Vit D-Min (CALCIUM  1200 PO) Take by mouth daily.     Cholecalciferol (VITAMIN D3) 50 MCG (2000 UT) capsule Take 2,000 Units by mouth daily.     diclofenac  Sodium (VOLTAREN ) 1 % GEL Apply 4 g topically 4 (four) times daily as needed. 500 g 6   etodolac (LODINE) 400 MG tablet      ezetimibe  (ZETIA ) 10 MG tablet Take 10 mg by mouth daily.     irbesartan  (AVAPRO ) 150 MG tablet Take 150 mg by mouth daily.     levothyroxine (SYNTHROID, LEVOTHROID) 88 MCG tablet Take 88 mcg by mouth daily.     Multiple Vitamin (MULTIVITAMIN) tablet Take 1 tablet by mouth daily.     naproxen (NAPROSYN) 250 MG tablet Take 250 mg by mouth 2 (two) times daily with a meal.     nitroGLYCERIN  (NITRODUR - DOSED IN MG/24 HR) 0.1 mg/hr patch APPLY 1/4 PATCH TO AFFECTED AREA 12 HOURS DAILY 30 patch 3   Omega-3 Fatty Acids (FISH OIL) 1200 MG CAPS Take by mouth daily.     pantoprazole (PROTONIX) 40 MG tablet Take 40 mg by mouth as needed.     rosuvastatin  (CRESTOR ) 20 MG tablet Take 1 tablet (20 mg total) by mouth daily. 90 tablet  3   venlafaxine XR (EFFEXOR-XR) 150 MG 24 hr capsule Take 150 mg by mouth daily. with food  4   No current facility-administered medications for this visit.   No results found.  Review of Systems:   A ROS was performed including pertinent positives and negatives as documented in the HPI.  Physical Exam :   Constitutional: NAD and appears stated age Neurological: Alert and oriented Psych: Appropriate affect and cooperative There were no vitals taken for this visit.   Comprehensive Musculoskeletal Exam:    Examination of bilateral hands demonstrates notable arthritic deformities and Heberden's nodes.  Pinpoint tenderness of bilateral first CMC joints.  Grip strength 3/5 bilaterally.  No evidence of overlying erythema or ecchymosis.  Radial pulses 2+.  Imaging:   Xray (right hand 3 views, left hand 3 views): Severe first CMC joint osteoarthritis bilaterally.  Diffuse osteoarthritis of the fingers most notable in bilateral thumb IP joints and DIP joints of the index fingers.   I personally reviewed and interpreted the radiographs.   Assessment:   77 y.o. female with advanced osteoarthritis in bilateral hands most notable at the first [facility] joints.  In discussion of treatment options, patient is agreeable with proceeding with conservative management of her acute flareup before proceeding with more invasive therapies.  I did offer a steroid taper although patient would like to hold off at this time as she has not tolerated this well in the past due to side effects to her mood.  Given this I have recommended daily use of naproxen as well as heat, gloves, and Voltaren  gel.  Should symptoms not improve over the next 2 weeks, could consider pros and cons of the steroid taper versus potential CMC injections.  Plan :    -Continue with conservative treatment including naproxen, heat, and Voltaren  gel -Return to clinic as needed     I personally saw and evaluated the patient, and participated  in the management and treatment plan.  Leonce Reveal, PA-C Orthopedics

## 2023-09-19 ENCOUNTER — Telehealth: Payer: Self-pay | Admitting: Student

## 2023-09-19 NOTE — Telephone Encounter (Signed)
 Pt called in stating she would like to try the 6 day Rx of low dose Prednisone please advise please send to [facility] on file

## 2023-09-20 ENCOUNTER — Other Ambulatory Visit (HOSPITAL_BASED_OUTPATIENT_CLINIC_OR_DEPARTMENT_OTHER): Payer: Self-pay | Admitting: Student

## 2023-09-20 MED ORDER — METHYLPREDNISOLONE 4 MG PO TBPK: ORAL_TABLET | ORAL | 0 refills | Status: AC

## 2024-03-03 ENCOUNTER — Other Ambulatory Visit: Payer: Self-pay | Admitting: Acute Care

## 2024-03-03 DIAGNOSIS — Z87891 Personal history of nicotine dependence: Secondary | ICD-10-CM

## 2024-03-03 DIAGNOSIS — Z122 Encounter for screening for malignant neoplasm of respiratory organs: Secondary | ICD-10-CM

## 2024-03-03 DIAGNOSIS — F17210 Nicotine dependence, cigarettes, uncomplicated: Secondary | ICD-10-CM

## 2024-03-18 ENCOUNTER — Ambulatory Visit (HOSPITAL_COMMUNITY)
Admission: RE | Admit: 2024-03-18 | Discharge: 2024-03-18 | Disposition: A | Discharge status: Home / Self Care | Source: Ambulatory Visit | Attending: Family Medicine | Admitting: Family Medicine

## 2024-03-18 DIAGNOSIS — Z122 Encounter for screening for malignant neoplasm of respiratory organs: Secondary | ICD-10-CM | POA: Insufficient documentation

## 2024-03-18 DIAGNOSIS — Z87891 Personal history of nicotine dependence: Secondary | ICD-10-CM | POA: Diagnosis present

## 2024-03-18 DIAGNOSIS — F17210 Nicotine dependence, cigarettes, uncomplicated: Secondary | ICD-10-CM | POA: Diagnosis present

## 2024-03-30 ENCOUNTER — Other Ambulatory Visit: Payer: Self-pay

## 2024-03-30 DIAGNOSIS — Z122 Encounter for screening for malignant neoplasm of respiratory organs: Secondary | ICD-10-CM

## 2024-03-30 DIAGNOSIS — F17210 Nicotine dependence, cigarettes, uncomplicated: Secondary | ICD-10-CM

## 2024-03-30 DIAGNOSIS — Z87891 Personal history of nicotine dependence: Secondary | ICD-10-CM

## 2024-04-01 ENCOUNTER — Telehealth: Payer: Self-pay

## 2024-04-01 NOTE — Telephone Encounter (Signed)
 Received a message from front desk to call patient regarding results of LDCT.  Called and spoke to patient.  She had a question about her coronary calcifications.  She was concerned she needed some follow up on that.  Reviewed findings with patient.  No suspicious findings for lung cancer.  Repeat CT in one year.  This scan noted calcifications of coronary arteries, which was not mentioned on previous scan.  Patient is on statin medication. She has follow up appt to discuss findings with PCP and she also has a cardiologist and plans to discuss with her also.  Patient states her daughter had a heart attack at age 40 years and she is always worried about her own cardiac status.  Advised her PCP and/or cardiologist should advise her as to the best path forward for her cardiac care.  The report does not specify how many vessels involved and does not list a degree of atherosclerosis or calcifications.  They would be best resources for any future recommendations.  She was concerned this was an urgent care need.  Advised since she has appointments for this month, that should be appropriate for this result finding.  Patient verbalized understanding.

## 2024-06-01 ENCOUNTER — Ambulatory Visit: Admitting: Physician Assistant

## 2024-06-01 NOTE — Progress Notes (Deleted)
  Cardiology Office Note   Date:  06/01/2024  ID:  Cheryl Burton, DOB [birth year], MRN [id number] PCP: Okey Carlin Redbird, MD  [facility] Providers Cardiologist:  Vina Okey, MD   History of Present Illness Cheryl Burton is a 78 y.o. female with a past medical history of hypertension, hyperlipidemia, GERD, hypothyroid, COPD, cardiovascular disease here for follow-up appointment.  History includes CT of the chest in June 2024 showing atherosclerosis of the aorta and referral to cardiology at this time.  She was seen a year ago and noted some epigastric pains that were not associated with activity.  Occurs every now and then with no shortness of breath.  Also has acid reflux.  She takes care of her home without any issues.  She does not do much walking outside.  Family history includes daughter who had a widow maker at 17 and also has a clotting disorder (being seen at [facility]).  Patient has not had any blood clots.  Today, she***  ROS: Pertinent ROS in HPI  Studies Reviewed      *** Risk Assessment/Calculations {Does this patient have ATRIAL FIBRILLATION?[phone]} No BP recorded.  {Refresh Note OR Click here to enter BP  :1}***       Physical Exam VS:  There were no vitals taken for this visit.       Wt Readings from Last 3 Encounters:  06/03/23 144 lb 12.8 oz (65.7 kg)  01/09/17 137 lb (62.1 kg)    GEN: Well nourished, well developed in no acute distress NECK: No JVD; No carotid bruits CARDIAC: ***RRR, no murmurs, rubs, gallops RESPIRATORY:  Clear to auscultation without rales, wheezing or rhonchi  ABDOMEN: Soft, non-tender, non-distended EXTREMITIES:  No edema; No deformity   ASSESSMENT AND PLAN Atherosclerosis of the aorta Hyperlipidemia Hypertension    {Are you ordering a CV Procedure (e.g. stress test, cath, DCCV, TEE, etc)?   Press F2        :[id number]}  Dispo: ***  Signed, Orren LOISE Fabry, PA-C

## 2024-06-12 ENCOUNTER — Ambulatory Visit: Attending: Physician Assistant | Admitting: Physician Assistant

## 2024-06-12 VITALS — BP 110/60 | HR 67 | Ht 61.0 in | Wt 145.0 lb

## 2024-06-12 DIAGNOSIS — I779 Disorder of arteries and arterioles, unspecified: Secondary | ICD-10-CM | POA: Diagnosis present

## 2024-06-12 DIAGNOSIS — I10 Essential (primary) hypertension: Secondary | ICD-10-CM | POA: Insufficient documentation

## 2024-06-12 DIAGNOSIS — Z79899 Other long term (current) drug therapy: Secondary | ICD-10-CM | POA: Diagnosis present

## 2024-06-12 DIAGNOSIS — E785 Hyperlipidemia, unspecified: Secondary | ICD-10-CM | POA: Insufficient documentation

## 2024-06-12 MED ORDER — IRBESARTAN 150 MG PO TABS: 150.0000 mg | ORAL_TABLET | Freq: Every day | ORAL | 3 refills | Status: AC

## 2024-06-12 MED ORDER — NITROGLYCERIN 0.1 MG/HR TD PT24: MEDICATED_PATCH | TRANSDERMAL | 3 refills | Status: AC

## 2024-06-12 MED ORDER — ROSUVASTATIN CALCIUM 20 MG PO TABS: 20.0000 mg | ORAL_TABLET | Freq: Every day | ORAL | 3 refills | Status: AC

## 2024-06-12 MED ORDER — EZETIMIBE 10 MG PO TABS: 10.0000 mg | ORAL_TABLET | Freq: Every day | ORAL | 3 refills | Status: AC

## 2024-06-12 NOTE — Progress Notes (Signed)
 Cardiology Office Note   Date:  06/12/2024  ID:  Cheryl Burton, DOB Feb 12, 1946, MRN [id number] PCP: Okey Carlin Redbird, MD  [facility] Providers Cardiologist:  Vina Okey, MD  APP: Orren Fabry, PA-C  History of Present Illness Cheryl Burton is a 78 y.o. female with a past medical history of hypertension, hyperlipidemia, GERD, hypothyroid, COPD, cardiovascular disease here for follow-up appointment.  History includes CT of the chest in June 2024 showing atherosclerosis of the aorta and referral to cardiology at this time.  She was seen a year ago and noted some epigastric pains that were not associated with activity.  Occurs every now and then with no shortness of breath.  Also has acid reflux.  She takes care of her home without any issues.  She does not do much walking outside.  Family history includes daughter who had a widow maker at 57 and also has a clotting disorder (being seen at [facility]).  Patient has not had any blood clots.  Today, she presents with cardiovascular evaluation and follow-up on lung cancer screening.  Her lipid profile from April shows LDL at 43 mg/dL, HDL at 53 mg/dL, triglycerides at 872 mg/dL, and total cholesterol at 118 mg/dL. She takes Zetia  10 mg, Crestor  20 mg, and fish oil 1200 mg daily. Her fish oil dosage is appropriate given her triglyceride levels.  A chest CT in July, part of lung cancer screening, shows aortic atherosclerosis and coronary artery calcification. Her lungs are clear with no suspicious pulmonary nodules, pleural effusion, pneumothorax, or new consolidation. She is concerned about these findings.   Her family history includes her daughter experiencing a myocardial infarction at 29 due to a blood clot in the main artery. Her daughter also has multiple sclerosis and a history of venous and arterial clots, requiring long-term anticoagulation therapy.  She inquires about a discolored vein on her leg, identified as a superficial spider  vein, with no swelling and a good pulse.   Reports no shortness of breath nor dyspnea on exertion. Reports no chest pain, pressure, or tightness. No edema, orthopnea, PND. Reports no palpitations.   Discussed the use of AI scribe software for clinical note transcription with the patient, who gave verbal consent to proceed.  ROS: Pertinent ROS in HPI  Studies Reviewed EKG Interpretation Date/Time:  Friday June 12 2024 14:03:26 EDT Ventricular Rate:  67 PR Interval:  150 QRS Duration:  86 QT Interval:  412 QTC Calculation: 435 R Axis:   -54  Text Interpretation: Normal sinus rhythm Left anterior fascicular block Moderate voltage criteria for LVH, may be normal variant ( R in aVL , Cornell product ) When compared with ECG of 03-Jun-2023 14:08, No significant change was found Confirmed by Fabry Orren [phone]) on 06/12/2024 4:24:09 PM    Carotid ultrasound 12/31/2022  Summary:  Right Carotid: Velocities in the right ICA are consistent with a 40-59%                 stenosis.   Left Carotid: Velocities in the left ICA are consistent with a 1-39%  stenosis.   Vertebrals: Bilateral vertebral arteries demonstrate antegrade flow.   Physical Exam VS:  BP 110/60 (BP Location: Left Arm, Patient Position: Sitting, Cuff Size: Normal)   Pulse 67   Ht 5' 1 (1.549 m)   Wt 145 lb (65.8 kg)   SpO2 94%   BMI 27.40 kg/m        Wt Readings from Last 3 Encounters:  06/12/24 145  lb (65.8 kg)  06/03/23 144 lb 12.8 oz (65.7 kg)  01/09/17 137 lb (62.1 kg)    GEN: Well nourished, well developed in no acute distress NECK: No JVD; No carotid bruits CARDIAC: RRR, no murmurs, rubs, gallops RESPIRATORY:  Clear to auscultation without rales, wheezing or rhonchi  ABDOMEN: Soft, non-tender, non-distended EXTREMITIES:  No edema; No deformity   ASSESSMENT AND PLAN  Hyperlipidemia on medical therapy Hyperlipidemia well-controlled with current therapy. Lipid profile within target range. - Continue  Zetia  10 mg daily. - Continue Crestor  20 mg daily. - Continue fish oil 1200 mg daily.  Aortic atherosclerosis and coronary artery calcification Aortic atherosclerosis and coronary artery calcification present on imaging. Asymptomatic with low likelihood of new calcium  formation due to low LDL levels. Medical therapy recommended unless symptoms develop. - Continue current medical therapy for hyperlipidemia. - Consider CT scan with contrast if symptoms develop.    COPD -recent CT scan reviewed with the patient -no SOB today   Carotid artery disease -recent carotid US  was reviewed -would order another US  next  year if not already done  Dispo: She can follow-up in a year with me  Signed, Orren LOISE Fabry, PA-C

## 2024-06-12 NOTE — Patient Instructions (Signed)
 Thank you for choosing [facility]!     Medication Instructions:  No medication changes were made during today's visit.  *If you need a refill on your cardiac medications before your next appointment, please call your pharmacy*   Lab Work: No labs were ordered during today's visit.  If you have labs (blood work) drawn today and your tests are completely normal, you will receive your results only by: MyChart Message (if you have MyChart) OR A paper copy in the mail If you have any lab test that is abnormal or we need to change your treatment, we will call you to review the results.   Testing/Procedures: No procedures were ordered during today's visit.   Your next appointment:   1 year(s)   Provider:   Orren Fabry, PA-C           Follow-Up: At [facility], you and your health needs are our priority.  As part of our continuing mission to provide you with exceptional heart care, we have created designated Provider Care Teams.  These Care Teams include your primary Cardiologist (physician) and Advanced Practice Providers (APPs -  Physician Assistants and Nurse Practitioners) who all work together to provide you with the care you need, when you need it. We recommend signing up for the patient portal called MyChart.  Sign up information is provided on this After Visit Summary.  MyChart is used to connect with patients for Virtual Visits (Telemedicine).  Patients are able to view lab/test results, encounter notes, upcoming appointments, etc.  Non-urgent messages can be sent to your provider as well.   To learn more about what you can do with MyChart, go to [email or web address].

## 2024-07-20 ENCOUNTER — Encounter: Payer: Self-pay | Admitting: Radiology
